# Patient Record
Sex: Female | Born: 1980 | Race: White | Hispanic: No | Marital: Married | Smoking: Never smoker
Health system: Southern US, Community
[De-identification: ages and names within clinical notes are randomized; demographics above are authoritative.]

## PROBLEM LIST (undated history)

## (undated) ENCOUNTER — Inpatient Hospital Stay (HOSPITAL_COMMUNITY): Payer: Self-pay

## (undated) DIAGNOSIS — K219 Gastro-esophageal reflux disease without esophagitis: Secondary | ICD-10-CM

## (undated) DIAGNOSIS — R112 Nausea with vomiting, unspecified: Secondary | ICD-10-CM

## (undated) DIAGNOSIS — Z9889 Other specified postprocedural states: Secondary | ICD-10-CM

## (undated) DIAGNOSIS — Z789 Other specified health status: Secondary | ICD-10-CM

---

## 2005-07-31 ENCOUNTER — Other Ambulatory Visit: Admission: RE | Admit: 2005-07-31 | Discharge: 2005-07-31 | Payer: Self-pay | Admitting: Obstetrics and Gynecology

## 2007-04-11 ENCOUNTER — Inpatient Hospital Stay (HOSPITAL_COMMUNITY): Admission: AD | Admit: 2007-04-11 | Discharge: 2007-04-12 | Payer: Self-pay | Admitting: Obstetrics and Gynecology

## 2007-04-11 ENCOUNTER — Ambulatory Visit: Payer: Self-pay | Admitting: Obstetrics and Gynecology

## 2007-06-26 ENCOUNTER — Inpatient Hospital Stay (HOSPITAL_COMMUNITY): Admission: AD | Admit: 2007-06-26 | Discharge: 2007-06-26 | Payer: Self-pay | Admitting: Obstetrics and Gynecology

## 2007-09-24 ENCOUNTER — Ambulatory Visit (HOSPITAL_COMMUNITY): Admission: RE | Admit: 2007-09-24 | Discharge: 2007-09-24 | Payer: Self-pay | Admitting: Obstetrics & Gynecology

## 2007-11-03 ENCOUNTER — Encounter (HOSPITAL_COMMUNITY): Payer: Self-pay | Admitting: Obstetrics and Gynecology

## 2007-11-03 ENCOUNTER — Ambulatory Visit: Payer: Self-pay | Admitting: Obstetrics & Gynecology

## 2007-11-03 ENCOUNTER — Inpatient Hospital Stay (HOSPITAL_COMMUNITY): Admission: AD | Admit: 2007-11-03 | Discharge: 2007-11-06 | Payer: Self-pay | Admitting: Obstetrics and Gynecology

## 2008-09-19 ENCOUNTER — Encounter: Admission: RE | Admit: 2008-09-19 | Discharge: 2008-09-19 | Payer: Self-pay | Admitting: Obstetrics and Gynecology

## 2008-11-15 ENCOUNTER — Encounter: Admission: RE | Admit: 2008-11-15 | Discharge: 2008-11-15 | Payer: Self-pay | Admitting: Family Medicine

## 2010-07-15 ENCOUNTER — Encounter: Payer: Self-pay | Admitting: Obstetrics and Gynecology

## 2010-11-06 NOTE — Op Note (Signed)
Kim Warner, Kim Warner               ACCOUNT NO.:  [id number]   MEDICAL RECORD NO.:  [id number]          PATIENT TYPE:  INP   LOCATION:  9318                          FACILITY:  [facility]   PHYSICIAN:  Zelphia Cairo, MD    DATE OF BIRTH:  [birth year]   DATE OF PROCEDURE:  11/03/2007  DATE OF DISCHARGE:                               OPERATIVE REPORT   PREOPERATIVE DIAGNOSES:  1. Twin intrauterine pregnancy at 31-2/7 weeks' gestation.  2. Vaginal bleeding.  3. Preterm labor.   PROCEDURE:  Primary low transverse cesarean delivery.   SURGEON:  Zelphia Cairo, MD   ASSISTANT:  Allie Bossier, MD   FINDINGS:  Normal pelvis.  Viable female infant x2.   SPECIMEN:  Placenta to pathology.   COMPLICATIONS:  None.   CONDITION:  Stable to recovery room.   ANESTHESIA:  Spinal.   DETAILS OF PROCEDURE:  The patient was taken to the operating room where  spinal anesthesia was found to be adequate.  She was placed in a supine  position with a left tilt.  She was prepped and draped sterilely and a  Foley catheter was inserted.  Pfannenstiel skin incision was made with a  scalpel and carried down to the underlying fascia.  The fascia was  incised in the midline.  This was extended laterally using Mayo  scissors.  Kocher clamps were used to tent up the inferior portion of  the fascia.  Fascia was dissected off the rectus muscles using Mayo  scissors.  Superior portion of the fascia was then tented upwards and  underlying rectus dissected off using the Bovie.  Peritoneum was then  identified, tented upwards, and entered sharply using Metzenbaum  scissors.  This was extended bluntly.  Bladder blade was then inserted.  Metzenbaum scissors were used to create a bladder flap.  Vesicouterine  peritoneum was then tucked under the bladder blade.   Uterine incision was then made with a scalpel and this was extended  bluntly.  Fetus A was found to be in the vertex position.  Fluid  appeared clear, slightly  blood-tinged.  Cord was clamped and cut and the  infant was taken to the awaiting pediatric staff.  Fetus B was then  found to be in the frank breech presentation.  Membranes were ruptured  for clear fluid and the infant was delivered using standard breech  maneuvers.  Cord was clamped and cut and the infant was taken to the  awaiting pediatric staff.  Placenta was then removed from the uterus and  sent for pathology.  The uterus was cleared off all clots and debris  using a dry lap sponge.  Uterine incision was reapproximated using 0  chromic in a running locked fashion.  Peritoneum was reapproximated with  0 Vicryl.  The fascia was closed with 0 PDS and the skin was closed with  staples.  The patient tolerated the procedure well.  Sponge, lap,  needle, and instrument counts were correct x2.      Zelphia Cairo, MD  Electronically Signed     GA/MEDQ  D:  11/03/2007  T:  11/04/2007  Job:  [id number]

## 2010-11-07 ENCOUNTER — Other Ambulatory Visit: Payer: Self-pay | Admitting: Family Medicine

## 2010-11-07 DIAGNOSIS — Z803 Family history of malignant neoplasm of breast: Secondary | ICD-10-CM

## 2010-11-07 DIAGNOSIS — Z1231 Encounter for screening mammogram for malignant neoplasm of breast: Secondary | ICD-10-CM

## 2010-11-09 NOTE — Discharge Summary (Signed)
Kim Warner, Kim Warner               ACCOUNT NO.:  [id number]   MEDICAL RECORD NO.:  [id number]          PATIENT TYPE:  INP   LOCATION:  9318                          FACILITY:  [facility]   PHYSICIAN:  Juluis Mire, M.D.   DATE OF BIRTH:  12/03/1980   DATE OF ADMISSION:  11/03/2007  DATE OF DISCHARGE:  11/06/2007                               DISCHARGE SUMMARY   ADMITTING DIAGNOSES:  1. Intrauterine pregnancy at 31-2/7th weeks' estimated gestational      age.  2. Twin gestation.  3. Preterm labor.  4. Vaginal bleeding.   DISCHARGE DIAGNOSES:  1. Status post low transverse cesarean section.  2. Viable female infants.   PROCEDURE:  Primary low transverse cesarean section.   REASON FOR ADMISSION:  Please see written H&P.   HOSPITAL COURSE:  The patient is a 30 year old primigravida that was  admitted to [facility] at 31-2/7th weeks' estimated  gestational age with a twin gestation complaining of vaginal bleeding.  The patient had experienced an acute onset of bright red bleeding  associated with pelvic cramping.  Upon presentation to the hospital, the  patient was noted to be contracting approximately every 3-5 minutes with  reassuring fetal heart tones x2.  On exam, vital signs were stable.  Blood pressure 123/76.  Fetal heart tones were reassuring.  Cervix was  examined to see a small amount of old blood noted in the wall and cervix  appeared to be closed with the speculum exam.  Ultrasound was performed  which revealed placenta normal without evidence of abruption or previa.  Normal amniotic fluid volume x2 and cervix had length of 2.7 cm.  Babies  were noted to be in the vertex breech presentation with 22% discordance  in growth.  The patient was now admitted for MAC tocolytics  betamethasone administration and continuous close monitoring.  After the  initiation of the magnesium sulfate, the patient did notice contractions  were less frequent, but she was somewhat  more uncomfortable.  Vaginal  bleeding had decreased.  Fetal heart tones continued to be reassuring  and cervical exam was deferred.  Magnesium sulfate was increased to 3 g  and the patient continued to have some contractions.  No further  bleeding and contractions were now approximately every 5 minutes.  Cervix was closed in posterium.  The patient was offered some Stadol.  over the course of the evening, contractions seemed to be less frequent  with no increase in vaginal bleeding and fetal heart tones continued to  be reassuring.  The patient was given the betamethasone and continued on  bedrest.  However later that evening, the patient did begin to have  increasing contractions in intensity and in frequency.  The patient was  up to the bathroom and experienced a large amount of bright red bleeding  with a large clot that was passed.  Speculum exam was performed  sterilely and was noted to have some bloody fluid within the vagina.  Cervix appeared to be closed, but about 90% effaced.  The patient was  not having to bleed through the contractions despite  the increase of the  magnesium sulfate to 3 g per hour.  The patient was advised regarding  possible abruption and preterm premature rupture of membranes and  progression of the preterm labor and decision was made to proceed with a  primary C-section.  The patient was now transferred to the operating  room where spinal anesthesia was administered without difficulty.  A low-  transverse incision was made with the delivery of viable A baby, a female  infant weighing 3 pounds 8 ounces with Apgars of 7 at 1 minute and 8 at  5 minutes.  Also baby B, a female infant weighing 4 pounds 7 ounces with  Apgars of 6 at 1 minute and 8 at 5 minutes.  Placenta was sent for  pathology and the patient tolerated the procedure well and was taken to  the recovery room in stable condition.  On postoperative day #1, the  patient was without complaint.  Babies  were both stable in the NICU on  CPAP.  Vital signs were stable.  She was afebrile.  Abdomen soft with  good return of bowel function.  Fundus was firm and nontender.  Abdominal dressing was noted to be clean, dry, and intact.  Foley was  draining with adequate amounts of urine output.  Laboratory findings  showed hemoglobin of 10.6, platelet count of 186,000, WBC count of 20.3.  On postoperative day #2, the patient was without complaint.  Babies  continued in the NICU on nasal cannula.  Vital signs were stable.  She  was afebrile.  Fundus firm and nontender.  Incision was clean, dry, and  intact.  She is voiding well.  On postoperative day #3, the patient was  without complaint.  Vital signs were stable.  Fundus firm and nontender.  Incision was clean, dry, and intact.  Staples were removed and the  patient was later discharged home.   CONDITION ON DISCHARGE:  Stable.   DIET:  Regular as tolerated.   ACTIVITY:  No heavy lifting, no driving x2 weeks, no vaginal entry.   FOLLOW-UP:  Patient to follow up in the office in 1-2 weeks for incision  check.  She is to call for temperature greater than 100 degrees,  persistent nausea or vomiting, heavy vaginal bleeding, and/or redness or  drainage from the incisional site.   DISCHARGE MEDICATIONS:  1. Tylox #30 one p.o. every 4-6 hours.  2. Motrin 600 mg every 6 hours.  3. Prenatal vitamins one p.o. daily.  4. Colace one p.o. daily.      Julio Sicks, N.P.      Juluis Mire, M.D.  Electronically Signed    CC/MEDQ  D:  11/25/2007  T:  11/25/2007  Job:  [id number]

## 2010-12-05 ENCOUNTER — Ambulatory Visit: Payer: Self-pay

## 2010-12-07 ENCOUNTER — Ambulatory Visit: Payer: Self-pay

## 2010-12-10 ENCOUNTER — Ambulatory Visit
Admission: RE | Admit: 2010-12-10 | Discharge: 2010-12-10 | Disposition: A | Discharge status: Home / Self Care | Payer: BC Managed Care – PPO | Source: Ambulatory Visit | Attending: Family Medicine | Admitting: Family Medicine

## 2010-12-10 DIAGNOSIS — Z1231 Encounter for screening mammogram for malignant neoplasm of breast: Secondary | ICD-10-CM

## 2011-01-10 ENCOUNTER — Other Ambulatory Visit: Payer: Self-pay

## 2011-03-14 LAB — URINALYSIS, ROUTINE W REFLEX MICROSCOPIC
Glucose, UA: NEGATIVE
Nitrite: NEGATIVE
Specific Gravity, Urine: >1.03 — ABNORMAL HIGH
pH: 5.5

## 2011-03-14 LAB — URINALYSIS, DIPSTICK ONLY
Bilirubin Urine: NEGATIVE
Nitrite: NEGATIVE
Protein, ur: NEGATIVE
Specific Gravity, Urine: >1.03 — ABNORMAL HIGH
Urobilinogen, UA: 0.2

## 2011-04-03 LAB — CBC
HCT: 22.2 — ABNORMAL LOW
HCT: 23.6 — ABNORMAL LOW
HCT: 23.6 — ABNORMAL LOW
HCT: 24.9 — ABNORMAL LOW
HCT: 26.9 — ABNORMAL LOW
Hemoglobin: 7.8 — CL
Hemoglobin: 8.3 — ABNORMAL LOW
Hemoglobin: 9.7 — ABNORMAL LOW
MCHC: 34.9
MCHC: 35.3
MCV: 90
MCV: 90.2
MCV: 90.6
MCV: 91.3
RBC: 2.46 — ABNORMAL LOW
RBC: 2.73 — ABNORMAL LOW
RDW: 11.7
RDW: 11.8
RDW: 11.8
RDW: 12
WBC: 14.6 — ABNORMAL HIGH
WBC: 15.9 — ABNORMAL HIGH
WBC: 17.2 — ABNORMAL HIGH

## 2011-04-03 LAB — DIFFERENTIAL
Eosinophils Absolute: 0
Lymphocytes Relative: 12
Monocytes Absolute: 0.8 — ABNORMAL HIGH
Monocytes Relative: 5

## 2011-04-03 LAB — CROSSMATCH
ABO/RH(D): O POS
Antibody Screen: NEGATIVE

## 2011-04-03 LAB — SAMPLE TO BLOOD BANK

## 2011-04-10 LAB — OB RESULTS CONSOLE GC/CHLAMYDIA
Chlamydia: NEGATIVE
Gonorrhea: NEGATIVE

## 2011-09-30 ENCOUNTER — Other Ambulatory Visit (HOSPITAL_COMMUNITY): Payer: Self-pay | Admitting: Obstetrics and Gynecology

## 2011-09-30 DIAGNOSIS — R1084 Generalized abdominal pain: Secondary | ICD-10-CM

## 2011-10-02 ENCOUNTER — Ambulatory Visit (HOSPITAL_COMMUNITY)
Admission: RE | Admit: 2011-10-02 | Discharge: 2011-10-02 | Disposition: A | Discharge status: Home / Self Care | Payer: BC Managed Care – PPO | Source: Ambulatory Visit | Attending: Obstetrics and Gynecology | Admitting: Obstetrics and Gynecology

## 2011-10-02 DIAGNOSIS — R1011 Right upper quadrant pain: Secondary | ICD-10-CM | POA: Insufficient documentation

## 2011-10-02 DIAGNOSIS — O99891 Other specified diseases and conditions complicating pregnancy: Secondary | ICD-10-CM | POA: Insufficient documentation

## 2011-10-02 DIAGNOSIS — R1084 Generalized abdominal pain: Secondary | ICD-10-CM

## 2011-10-18 ENCOUNTER — Encounter (HOSPITAL_COMMUNITY): Payer: Self-pay | Admitting: Pharmacist

## 2011-10-18 LAB — OB RESULTS CONSOLE GBS: GBS: NEGATIVE

## 2011-10-30 ENCOUNTER — Encounter (HOSPITAL_COMMUNITY)
Admission: RE | Admit: 2011-10-30 | Discharge: 2011-10-30 | Disposition: A | Discharge status: Home / Self Care | Payer: BC Managed Care – PPO | Source: Ambulatory Visit | Attending: Obstetrics and Gynecology | Admitting: Obstetrics and Gynecology

## 2011-10-30 ENCOUNTER — Encounter (HOSPITAL_COMMUNITY): Payer: Self-pay

## 2011-10-30 HISTORY — DX: Gastro-esophageal reflux disease without esophagitis: K21.9

## 2011-10-30 HISTORY — DX: Other specified postprocedural states: Z98.890

## 2011-10-30 HISTORY — DX: Other specified health status: Z78.9

## 2011-10-30 HISTORY — DX: Nausea with vomiting, unspecified: R11.2

## 2011-10-30 LAB — SURGICAL PCR SCREEN
MRSA, PCR: NEGATIVE
Staphylococcus aureus: NEGATIVE

## 2011-10-30 LAB — CBC
Platelets: 150 10*3/uL (ref 150–400)
RDW: 13.8 % (ref 11.5–15.5)
WBC: 11.2 10*3/uL — ABNORMAL HIGH (ref 4.0–10.5)

## 2011-10-30 LAB — RPR: RPR Ser Ql: NONREACTIVE

## 2011-10-30 NOTE — Patient Instructions (Addendum)
YOUR PROCEDURE IS SCHEDULED ON:11/05/11  ENTER THROUGH THE MAIN ENTRANCE OF [facility] AT:1130 am  USE DESK PHONE AND DIAL [id number] TO INFORM us OF YOUR ARRIVAL  CALL [phone] IF YOU HAVE ANY QUESTIONS OR PROBLEMS PRIOR TO YOUR ARRIVAL.  REMEMBER: DO NOT EAT AFTER MIDNIGHT : Monday  SPECIAL INSTRUCTIONS:clear liquids ok until 9am on Tuesday   YOU MAY BRUSH YOUR TEETH THE MORNING OF SURGERY   TAKE THESE MEDICINES THE DAY OF SURGERY WITH SIP OF WATER:Nexium   DO NOT WEAR JEWELRY, EYE MAKEUP, LIPSTICK OR DARK FINGERNAIL POLISH DO NOT WEAR LOTIONS  DO NOT SHAVE FOR 48 HOURS PRIOR TO SURGERY  YOU WILL NOT BE ALLOWED TO DRIVE YOURSELF HOME.  NAME OF DRIVER:Kerry Meloni

## 2011-11-03 ENCOUNTER — Encounter (HOSPITAL_COMMUNITY): Payer: Self-pay

## 2011-11-03 ENCOUNTER — Inpatient Hospital Stay (HOSPITAL_COMMUNITY)
Admission: AD | Admit: 2011-11-03 | Discharge: 2011-11-03 | Disposition: A | Discharge status: Home / Self Care | Payer: BC Managed Care – PPO | Source: Ambulatory Visit | Attending: Obstetrics and Gynecology | Admitting: Obstetrics and Gynecology

## 2011-11-03 DIAGNOSIS — O212 Late vomiting of pregnancy: Secondary | ICD-10-CM | POA: Insufficient documentation

## 2011-11-03 DIAGNOSIS — O479 False labor, unspecified: Secondary | ICD-10-CM | POA: Insufficient documentation

## 2011-11-03 LAB — URINALYSIS, ROUTINE W REFLEX MICROSCOPIC
Glucose, UA: NEGATIVE mg/dL
Hgb urine dipstick: NEGATIVE
Leukocytes, UA: NEGATIVE
Specific Gravity, Urine: <1.005 — ABNORMAL LOW (ref 1.005–1.030)

## 2011-11-03 NOTE — Discharge Instructions (Signed)
Fetal Movement Counts Patient Name: __________________________________________________ Patient Due Date: ____________________ Kick counts is highly recommended in high risk pregnancies, but it is a good idea for every pregnant woman to do. Start counting fetal movements at 28 weeks of the pregnancy. Fetal movements increase after eating a full meal or eating or drinking something sweet (the blood sugar is higher). It is also important to drink plenty of fluids (well hydrated) before doing the count. Lie on your left side because it helps with the circulation or you can sit in a comfortable chair with your arms over your belly (abdomen) with no distractions around you. DOING THE COUNT  Try to do the count the same time of day each time you do it.   Mark the day and time, then see how long it takes for you to feel 10 movements (kicks, flutters, swishes, rolls). You should have at least 10 movements within 2 hours. You will most likely feel 10 movements in much less than 2 hours. If you do not, wait an hour and count again. After a couple of days you will see a pattern.   What you are looking for is a change in the pattern or not enough counts in 2 hours. Is it taking longer in time to reach 10 movements?  SEEK MEDICAL CARE IF:  You feel less than 10 counts in 2 hours. Tried twice.   No movement in one hour.   The pattern is changing or taking longer each day to reach 10 counts in 2 hours.   You feel the baby is not moving as it usually does.  Date: ____________ Movements: ____________ Start time: ____________ Finish time: ____________  Date: ____________ Movements: ____________ Start time: ____________ Finish time: ____________ Date: ____________ Movements: ____________ Start time: ____________ Finish time: ____________ Date: ____________ Movements: ____________ Start time: ____________ Finish time: ____________ Date: ____________ Movements: ____________ Start time: ____________ Finish time:  ____________ Date: ____________ Movements: ____________ Start time: ____________ Finish time: ____________ Date: ____________ Movements: ____________ Start time: ____________ Finish time: ____________ Date: ____________ Movements: ____________ Start time: ____________ Finish time: ____________  Date: ____________ Movements: ____________ Start time: ____________ Finish time: ____________ Date: ____________ Movements: ____________ Start time: ____________ Finish time: ____________ Date: ____________ Movements: ____________ Start time: ____________ Finish time: ____________ Date: ____________ Movements: ____________ Start time: ____________ Finish time: ____________ Date: ____________ Movements: ____________ Start time: ____________ Finish time: ____________ Date: ____________ Movements: ____________ Start time: ____________ Finish time: ____________ Date: ____________ Movements: ____________ Start time: ____________ Finish time: ____________  Date: ____________ Movements: ____________ Start time: ____________ Finish time: ____________ Date: ____________ Movements: ____________ Start time: ____________ Finish time: ____________ Date: ____________ Movements: ____________ Start time: ____________ Finish time: ____________ Date: ____________ Movements: ____________ Start time: ____________ Finish time: ____________ Date: ____________ Movements: ____________ Start time: ____________ Finish time: ____________ Date: ____________ Movements: ____________ Start time: ____________ Finish time: ____________ Date: ____________ Movements: ____________ Start time: ____________ Finish time: ____________  Date: ____________ Movements: ____________ Start time: ____________ Finish time: ____________ Date: ____________ Movements: ____________ Start time: ____________ Finish time: ____________ Date: ____________ Movements: ____________ Start time: ____________ Finish time: ____________ Date: ____________ Movements:  ____________ Start time: ____________ Finish time: ____________ Date: ____________ Movements: ____________ Start time: ____________ Finish time: ____________ Date: ____________ Movements: ____________ Start time: ____________ Finish time: ____________ Date: ____________ Movements: ____________ Start time: ____________ Finish time: ____________  Date: ____________ Movements: ____________ Start time: ____________ Finish time: ____________ Date: ____________ Movements: ____________ Start time: ____________ Finish time: ____________ Date: ____________ Movements: ____________ Start time:   ____________ Finish time: ____________ Date: ____________ Movements: ____________ Start time: ____________ Finish time: ____________ Date: ____________ Movements: ____________ Start time: ____________ Finish time: ____________ Date: ____________ Movements: ____________ Start time: ____________ Finish time: ____________ Date: ____________ Movements: ____________ Start time: ____________ Finish time: ____________  Date: ____________ Movements: ____________ Start time: ____________ Finish time: ____________ Date: ____________ Movements: ____________ Start time: ____________ Finish time: ____________ Date: ____________ Movements: ____________ Start time: ____________ Finish time: ____________ Date: ____________ Movements: ____________ Start time: ____________ Finish time: ____________ Date: ____________ Movements: ____________ Start time: ____________ Finish time: ____________ Date: ____________ Movements: ____________ Start time: ____________ Finish time: ____________ Date: ____________ Movements: ____________ Start time: ____________ Finish time: ____________  Date: ____________ Movements: ____________ Start time: ____________ Finish time: ____________ Date: ____________ Movements: ____________ Start time: ____________ Finish time: ____________ Date: ____________ Movements: ____________ Start time: ____________ Finish  time: ____________ Date: ____________ Movements: ____________ Start time: ____________ Finish time: ____________ Date: ____________ Movements: ____________ Start time: ____________ Finish time: ____________ Date: ____________ Movements: ____________ Start time: ____________ Finish time: ____________ Date: ____________ Movements: ____________ Start time: ____________ Finish time: ____________  Date: ____________ Movements: ____________ Start time: ____________ Finish time: ____________ Date: ____________ Movements: ____________ Start time: ____________ Finish time: ____________ Date: ____________ Movements: ____________ Start time: ____________ Finish time: ____________ Date: ____________ Movements: ____________ Start time: ____________ Finish time: ____________ Date: ____________ Movements: ____________ Start time: ____________ Finish time: ____________ Date: ____________ Movements: ____________ Start time: ____________ Finish time: ____________ Document Released: 07/10/2006 Document Revised: 05/30/2011 Document Reviewed: 01/10/2009 [employer] Patient Information 2012 [employer].Braxton Hicks Contractions Pregnancy is commonly associated with contractions of the uterus throughout the pregnancy. Towards the end of pregnancy (32 to 34 weeks), these contractions (Braxton Hicks) can develop more often and may become more forceful. This is not true labor because these contractions do not result in opening (dilatation) and thinning of the cervix. They are sometimes difficult to tell apart from true labor because these contractions can be forceful and people have different pain tolerances. You should not feel embarrassed if you go to the hospital with false labor. Sometimes, the only way to tell if you are in true labor is for your caregiver to follow the changes in the cervix. How to tell the difference between true and false labor:  False labor.   The contractions of false labor are usually shorter,  irregular and not as hard as those of true labor.   They are often felt in the front of the lower abdomen and in the groin.   They may leave with walking around or changing positions while lying down.   They get weaker and are shorter lasting as time goes on.   These contractions are usually irregular.   They do not usually become progressively stronger, regular and closer together as with true labor.   True labor.   Contractions in true labor last 30 to 70 seconds, become very regular, usually become more intense, and increase in frequency.   They do not go away with walking.   The discomfort is usually felt in the top of the uterus and spreads to the lower abdomen and low back.   True labor can be determined by your caregiver with an exam. This will show that the cervix is dilating and getting thinner.  If there are no prenatal problems or other health problems associated with the pregnancy, it is completely safe to be sent home with false labor and await the onset of true labor. HOME CARE INSTRUCTIONS   Keep up   with your usual exercises and instructions.   Take medications as directed.   Keep your regular prenatal appointment.   Eat and drink lightly if you think you are going into labor.   If BH contractions are making you uncomfortable:   Change your activity position from lying down or resting to walking/walking to resting.   Sit and rest in a tub of warm water.   Drink 2 to 3 glasses of water. Dehydration may cause B-H contractions.   Do slow and deep breathing several times an hour.  SEEK IMMEDIATE MEDICAL CARE IF:   Your contractions continue to become stronger, more regular, and closer together.   You have a gushing, burst or leaking of fluid from the vagina.   An oral temperature above 102 F (38.9 C) develops.   You have passage of blood-tinged mucus.   You develop vaginal bleeding.   You develop continuous belly (abdominal) pain.   You have low  back pain that you never had before.   You feel the baby's head pushing down causing pelvic pressure.   The baby is not moving as much as it used to.  Document Released: 06/10/2005 Document Revised: 05/30/2011 Document Reviewed: 12/02/2008 [employer] Patient Information 2012 [employer]. 

## 2011-11-03 NOTE — MAU Note (Signed)
Patient presents with c/o of nausea since Thursday irregular contractions some are painful, frequent bowel movements but not diarrhea. Patient is a scheduled C/S for Tuesday was told by on call nurse to come in and be checked.

## 2011-11-05 ENCOUNTER — Inpatient Hospital Stay (HOSPITAL_COMMUNITY)
Admission: RE | Admit: 2011-11-05 | Discharge: 2011-11-08 | DRG: 370 | Disposition: A | Discharge status: Home / Self Care | Payer: BC Managed Care – PPO | Source: Ambulatory Visit | Attending: Obstetrics and Gynecology | Admitting: Obstetrics and Gynecology

## 2011-11-05 ENCOUNTER — Encounter (HOSPITAL_COMMUNITY)
Admission: RE | Disposition: A | Discharge status: Home / Self Care | Payer: Self-pay | Source: Ambulatory Visit | Attending: Obstetrics and Gynecology

## 2011-11-05 ENCOUNTER — Encounter (HOSPITAL_COMMUNITY): Payer: Self-pay | Admitting: *Deleted

## 2011-11-05 ENCOUNTER — Encounter (HOSPITAL_COMMUNITY): Payer: Self-pay | Admitting: Anesthesiology

## 2011-11-05 ENCOUNTER — Inpatient Hospital Stay (HOSPITAL_COMMUNITY): Payer: BC Managed Care – PPO | Admitting: Anesthesiology

## 2011-11-05 DIAGNOSIS — O34219 Maternal care for unspecified type scar from previous cesarean delivery: Principal | ICD-10-CM | POA: Diagnosis present

## 2011-11-05 DIAGNOSIS — L02219 Cutaneous abscess of trunk, unspecified: Secondary | ICD-10-CM | POA: Diagnosis not present

## 2011-11-05 DIAGNOSIS — O909 Complication of the puerperium, unspecified: Secondary | ICD-10-CM | POA: Diagnosis not present

## 2011-11-05 DIAGNOSIS — L03319 Cellulitis of trunk, unspecified: Secondary | ICD-10-CM | POA: Diagnosis not present

## 2011-11-05 DIAGNOSIS — Z302 Encounter for sterilization: Secondary | ICD-10-CM

## 2011-11-05 SURGERY — Surgical Case
Anesthesia: Spinal | Site: Abdomen | Wound class: Clean Contaminated

## 2011-11-05 MED ORDER — MORPHINE SULFATE 0.5 MG/ML IJ SOLN
INTRAMUSCULAR | Status: AC
  Filled 2011-11-05: qty 10

## 2011-11-05 MED ORDER — FENTANYL CITRATE 0.05 MG/ML IJ SOLN
INTRAMUSCULAR | Status: AC
  Filled 2011-11-05: qty 2

## 2011-11-05 MED ORDER — MENTHOL 3 MG MT LOZG: 1.0000 | LOZENGE | OROMUCOSAL | Status: DC | PRN

## 2011-11-05 MED ORDER — MEPERIDINE HCL 25 MG/ML IJ SOLN: 6.2500 mg | INTRAMUSCULAR | Status: DC | PRN

## 2011-11-05 MED ORDER — NALOXONE HCL 0.4 MG/ML IJ SOLN: 0.4000 mg | INTRAMUSCULAR | Status: DC | PRN

## 2011-11-05 MED ORDER — TETANUS-DIPHTH-ACELL PERTUSSIS 5-2.5-18.5 LF-MCG/0.5 IM SUSP
0.5000 mL | Freq: Once | INTRAMUSCULAR | Status: DC
  Filled 2011-11-05: qty 0.5

## 2011-11-05 MED ORDER — DIBUCAINE 1 % RE OINT: 1.0000 "application " | TOPICAL_OINTMENT | RECTAL | Status: DC | PRN

## 2011-11-05 MED ORDER — PHENYLEPHRINE HCL 10 MG/ML IJ SOLN
INTRAMUSCULAR | Status: DC | PRN
  Administered 2011-11-05: 80 ug via INTRAVENOUS
  Administered 2011-11-05: 40 ug via INTRAVENOUS
  Administered 2011-11-05: 80 ug via INTRAVENOUS

## 2011-11-05 MED ORDER — ONDANSETRON HCL 4 MG/2ML IJ SOLN
INTRAMUSCULAR | Status: AC
  Filled 2011-11-05: qty 2

## 2011-11-05 MED ORDER — DIPHENHYDRAMINE HCL 50 MG/ML IJ SOLN
12.5000 mg | INTRAMUSCULAR | Status: DC | PRN
Start: 2011-11-05 — End: 2011-11-08

## 2011-11-05 MED ORDER — MONTELUKAST SODIUM 10 MG PO TABS
10.0000 mg | ORAL_TABLET | Freq: Every day | ORAL | Status: DC
  Administered 2011-11-05 – 2011-11-07 (×3): 10 mg via ORAL
  Filled 2011-11-05 (×3): qty 1

## 2011-11-05 MED ORDER — NALBUPHINE HCL 10 MG/ML IJ SOLN
5.0000 mg | INTRAMUSCULAR | Status: DC | PRN
Start: 2011-11-05 — End: 2011-11-08
  Filled 2011-11-05: qty 1

## 2011-11-05 MED ORDER — CEFAZOLIN SODIUM 1-5 GM-% IV SOLN
INTRAVENOUS | Status: AC
  Filled 2011-11-05: qty 50

## 2011-11-05 MED ORDER — PHENYLEPHRINE 40 MCG/ML (10ML) SYRINGE FOR IV PUSH (FOR BLOOD PRESSURE SUPPORT)
PREFILLED_SYRINGE | INTRAVENOUS | Status: AC
  Filled 2011-11-05: qty 5

## 2011-11-05 MED ORDER — SODIUM CHLORIDE 0.9 % IV SOLN: 1.0000 ug/kg/h | INTRAVENOUS | Status: DC | PRN

## 2011-11-05 MED ORDER — FENTANYL CITRATE 0.05 MG/ML IJ SOLN
INTRAMUSCULAR | Status: DC | PRN
  Administered 2011-11-05: 25 ug via INTRATHECAL

## 2011-11-05 MED ORDER — KETOROLAC TROMETHAMINE 30 MG/ML IJ SOLN
INTRAMUSCULAR | Status: AC
  Administered 2011-11-05: 30 mg via INTRAMUSCULAR
  Filled 2011-11-05: qty 1

## 2011-11-05 MED ORDER — ONDANSETRON HCL 4 MG PO TABS: 4.0000 mg | ORAL_TABLET | ORAL | Status: DC | PRN

## 2011-11-05 MED ORDER — NALBUPHINE HCL 10 MG/ML IJ SOLN
5.0000 mg | INTRAMUSCULAR | Status: DC | PRN
  Administered 2011-11-05 (×2): 5 mg via SUBCUTANEOUS
  Filled 2011-11-05: qty 1

## 2011-11-05 MED ORDER — DIPHENHYDRAMINE HCL 25 MG PO CAPS
25.0000 mg | ORAL_CAPSULE | ORAL | Status: DC | PRN
  Administered 2011-11-05 – 2011-11-06 (×2): 25 mg via ORAL
  Filled 2011-11-05 (×4): qty 1

## 2011-11-05 MED ORDER — EPHEDRINE SULFATE 50 MG/ML IJ SOLN
INTRAMUSCULAR | Status: DC | PRN
  Administered 2011-11-05 (×2): 5 mg via INTRAVENOUS

## 2011-11-05 MED ORDER — ONDANSETRON HCL 4 MG/2ML IJ SOLN: 4.0000 mg | INTRAMUSCULAR | Status: DC | PRN

## 2011-11-05 MED ORDER — SIMETHICONE 80 MG PO CHEW
80.0000 mg | CHEWABLE_TABLET | ORAL | Status: DC | PRN
  Administered 2011-11-07: 80 mg via ORAL

## 2011-11-05 MED ORDER — SENNOSIDES-DOCUSATE SODIUM 8.6-50 MG PO TABS
2.0000 | ORAL_TABLET | Freq: Every day | ORAL | Status: DC
  Administered 2011-11-05 – 2011-11-07 (×3): 2 via ORAL

## 2011-11-05 MED ORDER — KETOROLAC TROMETHAMINE 30 MG/ML IJ SOLN
30.0000 mg | Freq: Four times a day (QID) | INTRAMUSCULAR | Status: AC | PRN
  Administered 2011-11-05 – 2011-11-06 (×2): 30 mg via INTRAVENOUS
  Filled 2011-11-05 (×2): qty 1

## 2011-11-05 MED ORDER — ACETAMINOPHEN 325 MG PO TABS: 325.0000 mg | ORAL_TABLET | ORAL | Status: DC | PRN

## 2011-11-05 MED ORDER — MORPHINE SULFATE (PF) 0.5 MG/ML IJ SOLN
INTRAMUSCULAR | Status: DC | PRN
  Administered 2011-11-05: .1 mg via INTRATHECAL

## 2011-11-05 MED ORDER — KETOROLAC TROMETHAMINE 30 MG/ML IJ SOLN
30.0000 mg | Freq: Four times a day (QID) | INTRAMUSCULAR | Status: AC | PRN
  Administered 2011-11-05: 30 mg via INTRAMUSCULAR

## 2011-11-05 MED ORDER — NALBUPHINE SYRINGE 5 MG/0.5 ML
INJECTION | INTRAMUSCULAR | Status: AC
  Administered 2011-11-05: 5 mg via SUBCUTANEOUS
  Filled 2011-11-05: qty 0.5

## 2011-11-05 MED ORDER — SODIUM CHLORIDE 0.9 % IJ SOLN: 3.0000 mL | INTRAMUSCULAR | Status: DC | PRN

## 2011-11-05 MED ORDER — CEFAZOLIN SODIUM 1-5 GM-% IV SOLN
1.0000 g | INTRAVENOUS | Status: AC
  Administered 2011-11-05: 1 g via INTRAVENOUS

## 2011-11-05 MED ORDER — BUPIVACAINE HCL (PF) 0.25 % IJ SOLN
INTRAMUSCULAR | Status: DC | PRN
  Administered 2011-11-05: 10 mL

## 2011-11-05 MED ORDER — EPHEDRINE 5 MG/ML INJ
INTRAVENOUS | Status: AC
  Filled 2011-11-05: qty 10

## 2011-11-05 MED ORDER — PROMETHAZINE HCL 25 MG/ML IJ SOLN: 6.2500 mg | INTRAMUSCULAR | Status: DC | PRN

## 2011-11-05 MED ORDER — FENTANYL CITRATE 0.05 MG/ML IJ SOLN: 25.0000 ug | INTRAMUSCULAR | Status: DC | PRN

## 2011-11-05 MED ORDER — BUPIVACAINE HCL (PF) 0.25 % IJ SOLN
INTRAMUSCULAR | Status: AC
  Filled 2011-11-05: qty 30

## 2011-11-05 MED ORDER — OXYTOCIN 20 UNITS IN LACTATED RINGERS INFUSION - SIMPLE
INTRAVENOUS | Status: DC | PRN
  Administered 2011-11-05: 20 [IU] via INTRAVENOUS

## 2011-11-05 MED ORDER — SIMETHICONE 80 MG PO CHEW
80.0000 mg | CHEWABLE_TABLET | Freq: Three times a day (TID) | ORAL | Status: DC
  Administered 2011-11-05 – 2011-11-08 (×9): 80 mg via ORAL

## 2011-11-05 MED ORDER — LORATADINE 10 MG PO TABS
10.0000 mg | ORAL_TABLET | Freq: Every day | ORAL | Status: DC
  Administered 2011-11-06 – 2011-11-08 (×3): 10 mg via ORAL
  Filled 2011-11-05 (×4): qty 1

## 2011-11-05 MED ORDER — LACTATED RINGERS IV SOLN
INTRAVENOUS | Status: DC
  Administered 2011-11-05 (×3): via INTRAVENOUS

## 2011-11-05 MED ORDER — IBUPROFEN 600 MG PO TABS
600.0000 mg | ORAL_TABLET | Freq: Four times a day (QID) | ORAL | Status: DC
  Administered 2011-11-06 – 2011-11-08 (×9): 600 mg via ORAL
  Filled 2011-11-05 (×9): qty 1

## 2011-11-05 MED ORDER — ZOLPIDEM TARTRATE 5 MG PO TABS: 5.0000 mg | ORAL_TABLET | Freq: Every evening | ORAL | Status: DC | PRN

## 2011-11-05 MED ORDER — OXYCODONE-ACETAMINOPHEN 5-325 MG PO TABS
1.0000 | ORAL_TABLET | ORAL | Status: DC | PRN
  Administered 2011-11-06 (×3): 2 via ORAL
  Administered 2011-11-06: 1 via ORAL
  Administered 2011-11-07 – 2011-11-08 (×7): 2 via ORAL
  Filled 2011-11-05: qty 2
  Filled 2011-11-05: qty 1
  Filled 2011-11-05 (×7): qty 2
  Filled 2011-11-05 (×2): qty 1
  Filled 2011-11-05: qty 2

## 2011-11-05 MED ORDER — BUPIVACAINE IN DEXTROSE 0.75-8.25 % IT SOLN
INTRATHECAL | Status: DC | PRN
  Administered 2011-11-05: 12 mg via INTRATHECAL

## 2011-11-05 MED ORDER — LACTATED RINGERS IV SOLN: INTRAVENOUS | Status: DC

## 2011-11-05 MED ORDER — ONDANSETRON HCL 4 MG/2ML IJ SOLN: 4.0000 mg | Freq: Three times a day (TID) | INTRAMUSCULAR | Status: DC | PRN

## 2011-11-05 MED ORDER — SCOPOLAMINE 1 MG/3DAYS TD PT72
1.0000 | MEDICATED_PATCH | Freq: Once | TRANSDERMAL | Status: DC
  Administered 2011-11-05: 1.5 mg via TRANSDERMAL

## 2011-11-05 MED ORDER — SCOPOLAMINE 1 MG/3DAYS TD PT72
MEDICATED_PATCH | TRANSDERMAL | Status: AC
  Administered 2011-11-05: 1.5 mg via TRANSDERMAL
  Filled 2011-11-05: qty 1

## 2011-11-05 MED ORDER — PRENATAL MULTIVITAMIN CH
1.0000 | ORAL_TABLET | Freq: Every day | ORAL | Status: DC
  Filled 2011-11-05 (×3): qty 1

## 2011-11-05 MED ORDER — MIDAZOLAM HCL 2 MG/2ML IJ SOLN: 0.5000 mg | Freq: Once | INTRAMUSCULAR | Status: DC | PRN

## 2011-11-05 MED ORDER — WITCH HAZEL-GLYCERIN EX PADS: 1.0000 "application " | MEDICATED_PAD | CUTANEOUS | Status: DC | PRN

## 2011-11-05 MED ORDER — LANOLIN HYDROUS EX OINT: 1.0000 "application " | TOPICAL_OINTMENT | CUTANEOUS | Status: DC | PRN

## 2011-11-05 MED ORDER — LACTATED RINGERS IV SOLN
INTRAVENOUS | Status: DC
  Administered 2011-11-05: via INTRAVENOUS

## 2011-11-05 MED ORDER — METOCLOPRAMIDE HCL 5 MG/ML IJ SOLN: 10.0000 mg | Freq: Three times a day (TID) | INTRAMUSCULAR | Status: DC | PRN

## 2011-11-05 MED ORDER — OXYTOCIN 20 UNITS IN LACTATED RINGERS INFUSION - SIMPLE
125.0000 mL/h | INTRAVENOUS | Status: AC
  Administered 2011-11-05: 125 mL/h via INTRAVENOUS

## 2011-11-05 MED ORDER — DIPHENHYDRAMINE HCL 50 MG/ML IJ SOLN: 25.0000 mg | INTRAMUSCULAR | Status: DC | PRN

## 2011-11-05 MED ORDER — SCOPOLAMINE 1 MG/3DAYS TD PT72: 1.0000 | MEDICATED_PATCH | Freq: Once | TRANSDERMAL | Status: DC

## 2011-11-05 MED ORDER — OXYTOCIN 20 UNITS IN LACTATED RINGERS INFUSION - SIMPLE
INTRAVENOUS | Status: AC
  Administered 2011-11-05: 125 mL/h via INTRAVENOUS
  Filled 2011-11-05: qty 1000

## 2011-11-05 MED ORDER — ACETAMINOPHEN 10 MG/ML IV SOLN: 1000.0000 mg | Freq: Four times a day (QID) | INTRAVENOUS | Status: AC | PRN

## 2011-11-05 MED ORDER — DIPHENHYDRAMINE HCL 25 MG PO CAPS
25.0000 mg | ORAL_CAPSULE | Freq: Four times a day (QID) | ORAL | Status: DC | PRN
  Administered 2011-11-07: 25 mg via ORAL

## 2011-11-05 MED ORDER — OXYTOCIN 10 UNIT/ML IJ SOLN
INTRAMUSCULAR | Status: AC
  Filled 2011-11-05: qty 4

## 2011-11-05 MED ORDER — ONDANSETRON HCL 4 MG/2ML IJ SOLN
INTRAMUSCULAR | Status: DC | PRN
  Administered 2011-11-05: 4 mg via INTRAVENOUS

## 2011-11-05 SURGICAL SUPPLY — 27 items
BARRIER ADHS 3X4 INTERCEED (GAUZE/BANDAGES/DRESSINGS) IMPLANT
CHLORAPREP W/TINT 26ML (MISCELLANEOUS) ×2 IMPLANT
CLOTH BEACON ORANGE TIMEOUT ST (SAFETY) ×2 IMPLANT
CONTAINER PREFILL 10% NBF 15ML (MISCELLANEOUS) IMPLANT
DRSG COVADERM 4X10 (GAUZE/BANDAGES/DRESSINGS) ×2 IMPLANT
ELECT REM PT RETURN 9FT ADLT (ELECTROSURGICAL) ×2
ELECTRODE REM PT RTRN 9FT ADLT (ELECTROSURGICAL) ×1 IMPLANT
EXTRACTOR VACUUM M CUP 4 TUBE (SUCTIONS) IMPLANT
GLOVE BIO SURGEON STRL SZ 6.5 (GLOVE) ×4 IMPLANT
GOWN PREVENTION PLUS LG XLONG (DISPOSABLE) ×6 IMPLANT
KIT ABG SYR 3ML LUER SLIP (SYRINGE) IMPLANT
NEEDLE HYPO 22GX1.5 SAFETY (NEEDLE) ×2 IMPLANT
NEEDLE HYPO 25X5/8 SAFETYGLIDE (NEEDLE) ×2 IMPLANT
NS IRRIG 1000ML POUR BTL (IV SOLUTION) ×2 IMPLANT
PACK C SECTION WH (CUSTOM PROCEDURE TRAY) ×2 IMPLANT
SLEEVE SCD COMPRESS KNEE MED (MISCELLANEOUS) IMPLANT
STAPLER VISISTAT 35W (STAPLE) IMPLANT
SUT CHROMIC 0 CTX 36 (SUTURE) ×4 IMPLANT
SUT PLAIN 0 NONE (SUTURE) IMPLANT
SUT PLAIN 2 0 XLH (SUTURE) IMPLANT
SUT VIC AB 0 CT1 27 (SUTURE) ×3
SUT VIC AB 0 CT1 27XBRD ANBCTR (SUTURE) ×3 IMPLANT
SUT VIC AB 4-0 KS 27 (SUTURE) IMPLANT
SYR CONTROL 10ML LL (SYRINGE) ×2 IMPLANT
TOWEL OR 17X24 6PK STRL BLUE (TOWEL DISPOSABLE) ×4 IMPLANT
TRAY FOLEY CATH 14FR (SET/KITS/TRAYS/PACK) ×2 IMPLANT
WATER STERILE IRR 1000ML POUR (IV SOLUTION) ×2 IMPLANT

## 2011-11-05 NOTE — Brief Op Note (Signed)
11/05/2011  1:44 PM  PATIENT:  Kim Warner  31 y.o. female  PRE-OPERATIVE DIAGNOSIS:  Previous Cesarean Section, IUP at 39 weeks, desires permanent sterilization  POST-OPERATIVE DIAGNOSIS:   Same  PROCEDURE:  Procedure(s) (LRB): Repeat Low Transverse CESAREAN SECTION (N/A) Bilateral Tubal Ligation with Filshie clips SURGEON:  Surgeon(s) and Role:    * Jeani Hawking, MD - Primary  PHYSICIAN ASSISTANT:   ASSISTANTS: none   ANESTHESIA:   spinal  EBL:  Total I/O In: 2000 [I.V.:2000] Out: 800 [Urine:300; Blood:500]  BLOOD ADMINISTERED:none  DRAINS: Urinary Catheter (Foley)   LOCAL MEDICATIONS USED:  XYLOCAINE   SPECIMEN:  No Specimen  DISPOSITION OF SPECIMEN:  N/A  COUNTS:  YES  TOURNIQUET:  * No tourniquets in log *  DICTATION: .Other Dictation: Dictation Number [id number]  PLAN OF CARE: Admit to inpatient   PATIENT DISPOSITION:  PACU - hemodynamically stable.   Delay start of Pharmacological VTE agent (>24hrs) due to surgical blood loss or risk of bleeding: yes

## 2011-11-05 NOTE — Anesthesia Procedure Notes (Signed)
Spinal  Patient location during procedure: OR Start time: 11/05/2011 1:09 PM Staffing Anesthesiologist: Brayton Caves R Performed by: anesthesiologist  Preanesthetic Checklist Completed: patient identified, site marked, surgical consent, pre-op evaluation, timeout performed, IV checked, risks and benefits discussed and monitors and equipment checked Spinal Block Patient position: sitting Prep: DuraPrep Patient monitoring: heart rate, cardiac monitor, continuous pulse ox and blood pressure Approach: midline Location: L3-4 Injection technique: single-shot Needle Needle type: Sprotte  Needle gauge: 24 G Needle length: 9 cm Assessment Sensory level: T4 Additional Notes Patient identified.  Risk benefits discussed including failed block, incomplete pain control, headache, nerve damage, paralysis, blood pressure changes, nausea, vomiting, reactions to medication both toxic or allergic, and postpartum back pain.  Patient expressed understanding and wished to proceed.  All questions were answered.  Sterile technique used throughout procedure.  CSF was clear.  No parasthesia or other complications.  Please see nursing notes for vital signs.

## 2011-11-05 NOTE — Transfer of Care (Signed)
Immediate Anesthesia Transfer of Care Note  Patient: Kim Warner  Procedure(s) Performed: Procedure(s) (LRB): CESAREAN SECTION (N/A)  Patient Location: PACU  Anesthesia Type: Spinal  Level of Consciousness: awake, alert  and oriented  Airway & Oxygen Therapy: Patient Spontanous Breathing  Post-op Assessment: Report given to PACU RN and Post -op Vital signs reviewed and stable  Post vital signs: Reviewed and stable  Complications: No apparent anesthesia complications

## 2011-11-05 NOTE — Anesthesia Preprocedure Evaluation (Signed)
Anesthesia Evaluation  Patient identified by MRN, date of birth, ID band Patient awake    Reviewed: Allergy & Precautions, H&P , NPO status , Patient's Chart, lab work & pertinent test results  History of Anesthesia Complications (+) PONV  Airway Mallampati: II      Dental No notable dental hx.    Pulmonary neg pulmonary ROS,  breath sounds clear to auscultation  Pulmonary exam normal       Cardiovascular Exercise Tolerance: Good negative cardio ROS  Rhythm:regular Rate:Normal     Neuro/Psych negative neurological ROS  negative psych ROS   GI/Hepatic negative GI ROS, Neg liver ROS, GERD-  Controlled,  Endo/Other  negative endocrine ROS  Renal/GU negative Renal ROS  negative genitourinary   Musculoskeletal   Abdominal Normal abdominal exam  (+)   Peds  Hematology negative hematology ROS (+)   Anesthesia Other Findings   Reproductive/Obstetrics (+) Pregnancy                           Anesthesia Physical Anesthesia Plan  ASA: II  Anesthesia Plan: Spinal   Post-op Pain Management:    Induction:   Airway Management Planned:   Additional Equipment:   Intra-op Plan:   Post-operative Plan:   Informed Consent: I have reviewed the patients History and Physical, chart, labs and discussed the procedure including the risks, benefits and alternatives for the proposed anesthesia with the patient or authorized representative who has indicated his/her understanding and acceptance.     Plan Discussed with: Anesthesiologist, CRNA and Surgeon  Anesthesia Plan Comments:         Anesthesia Quick Evaluation  

## 2011-11-05 NOTE — Anesthesia Postprocedure Evaluation (Signed)
Anesthesia Post Note  Patient: Kim Warner  Procedure(s) Performed: Procedure(s) (LRB): CESAREAN SECTION (N/A)  Anesthesia type: Spinal  Patient location: PACU  Post pain: Pain level controlled  Post assessment: Post-op Vital signs reviewed  Last Vitals:  Filed Vitals:   11/05/11 1430  BP: 105/47  Pulse: 64  Temp:   Resp: 20    Post vital signs: Reviewed  Level of consciousness: awake  Complications: No apparent anesthesia complications

## 2011-11-05 NOTE — Op Note (Signed)
NAMENANDANA, KROLIKOWSKI               ACCOUNT NO.:  [id number]  MEDICAL RECORD NO.:  [id number]  LOCATION:  9127                          FACILITY:  [facility]  PHYSICIAN:  Benjamim Harnish L. Kerstyn Coryell, M.D.DATE OF BIRTH:  [birth year]  DATE OF PROCEDURE:  11/05/2011 DATE OF DISCHARGE:                              OPERATIVE REPORT   PREOPERATIVE DIAGNOSES:  Previous cesarean section, intrauterine pregnancy at 39 weeks and desires permanent sterilization.  POSTOPERATIVE DIAGNOSES:  Previous cesarean section, intrauterine pregnancy at 39 weeks and desires permanent sterilization.  PROCEDURE:  Repeat low transverse cesarean section and bilateral tubal ligation with Filshie clips.  SURGEON:  Keyanni Whittinghill L. Vincente Poli, M.D.  ASSISTANT:  None.  ANESTHESIA:  Spinal.  BLOOD ADMINISTERED:  None.  DRAINS:  Foley.  PATHOLOGY:  None.  PROCEDURE:  The patient was taken to the operating room.  Her spinal was placed and she was placed in the usual position.  She was prepped and draped.  A Foley catheter was inserted and draining clear urine.  A low transverse incision was made, carried down to the fascia.  Fascia scored in midline, extended laterally.  Rectus muscles were separated in the midline.  The peritoneum was entered bluntly.  The peritoneal incision was then stretched.  The bladder blade was inserted.  The lower uterine segment was identified and the bladder flap was created sharply and then digitally.  The bladder blade was then readjusted.  A low transverse incision was made in the uterus.  Uterus was entered using a hemostat. Amniotic fluid was clear.  The baby was in cephalic presentation, was a female infant and delivered easily with 1 pull of the vacuum without a pop off.  The cord was clamped and cut.  The baby was handed to the awaiting neonatal team.  Apgars were 9 and 9.  The uterus was exteriorized, cleared of all clots and debris.  The placenta was removed, noted to be normal, intact with a  3-vessel cord.  The uterine incision was closed in 1 layer using 0 chromic in a running, locked stitch.  At this point, we then placed Filshie clips across each mid portion of the fallopian tube, making sure we identified the tube out to the fimbriated end.  Adnexa were normal.  The uterus was returned to the abdomen.  Irrigation was performed.  Hemostasis was excellent.  The peritoneum was closed using 0 Vicryl.  The fascia was closed using 0 Vicryl running stitch.  After irrigation of the subcutaneous layer, the skin was closed with a subcuticular using 4-0 Vicryl, and local was infiltrated, Dermabond was applied.  All sponge, lap, and instrument counts were correct x2.  The patient went to recovery room in stable condition.     Brandn Mcgath L. Vincente Poli, M.D.     Florestine Avers  D:  11/05/2011  T:  11/05/2011  Job:  [id number]

## 2011-11-05 NOTE — H&P (Signed)
31 year old G 2 P 0 presents for Repeat LTCS and BTL. No complaints. PNC see [facility] GBBS is negative  Afebrile Vital signs stable General alert and oriented Lung CTA B Car RRR Abdomen is soft and gravid  IMPRESSION: IUP at 39 weeks plus Her EDC is 11/12/11 and Incorrect date is entered in EPIC Previous C Section x 1 Desires permanent sterilization  PLAN: Repeat LTCS BTL Consent signed

## 2011-11-06 ENCOUNTER — Encounter (HOSPITAL_COMMUNITY): Payer: Self-pay | Admitting: Obstetrics and Gynecology

## 2011-11-06 LAB — CBC
HCT: 33.9 % — ABNORMAL LOW (ref 36.0–46.0)
Hemoglobin: 11.6 g/dL — ABNORMAL LOW (ref 12.0–15.0)
MCV: 94.7 fL (ref 78.0–100.0)
WBC: 16 10*3/uL — ABNORMAL HIGH (ref 4.0–10.5)

## 2011-11-06 MED ORDER — POLYETHYLENE GLYCOL 3350 17 G PO PACK
17.0000 g | PACK | Freq: Every day | ORAL | Status: DC
  Administered 2011-11-06 – 2011-11-07 (×2): 17 g via ORAL
  Filled 2011-11-06 (×3): qty 1

## 2011-11-06 NOTE — Addendum Note (Signed)
Addendum  created 11/06/11 0943 by Shanon Payor, CRNA   Modules edited:Notes Section

## 2011-11-06 NOTE — Progress Notes (Signed)
Subjective: Postpartum Day 1: Cesarean Delivery Patient reports incisional pain, tolerating PO, + flatus and no problems voiding.    Objective: Vital signs in last 24 hours: Temp:  [97.6 F (36.4 C)-98.8 F (37.1 C)] 98.4 F (36.9 C) (05/15 0558) Pulse Rate:  [54-88] 63  (05/15 0558) Resp:  [15-25] 18  (05/15 0558) BP: (74-116)/(44-66) 98/60 mmHg (05/15 0558) SpO2:  [95 %-100 %] 96 % (05/15 0558) Weight:  [81.647 kg (180 lb)] 81.647 kg (180 lb) (05/14 1655)  Physical Exam:  General: alert and cooperative Lochia: appropriate Uterine Fundus: firm Incision: healing well DVT Evaluation: No evidence of DVT seen on physical exam.   Basename 11/06/11 0510  HGB 11.6*  HCT 33.9*    Assessment/Plan: Status post Cesarean section. Doing well postoperatively.  Continue current care Miralax qd.  Laylynn Campanella G 11/06/2011, 7:54 AM

## 2011-11-06 NOTE — Anesthesia Postprocedure Evaluation (Signed)
  Anesthesia Post-op Note  Patient: Kim Warner  Procedure(s) Performed: Procedure(s) (LRB): CESAREAN SECTION (N/A)  Patient Location: Mother/Baby  Anesthesia Type: Spinal  Level of Consciousness: awake, alert  and oriented  Airway and Oxygen Therapy: Patient Spontanous Breathing  Post-op Pain: none  Post-op Assessment: Post-op Vital signs reviewed, Patient's Cardiovascular Status Stable, No headache, No backache, No residual numbness and No residual motor weakness  Post-op Vital Signs: Reviewed and stable  Complications: No apparent anesthesia complications

## 2011-11-07 NOTE — Progress Notes (Signed)
Subjective: Postpartum Day 2: Cesarean Delivery Patient reports tolerating PO, + flatus and no problems voiding.    Objective: Vital signs in last 24 hours: Temp:  [98 F (36.7 C)-98.7 F (37.1 C)] 98.2 F (36.8 C) (05/16 0543) Pulse Rate:  [63-79] 79  (05/16 0543) Resp:  [18] 18  (05/16 0543) BP: (95-119)/(61-74) 119/74 mmHg (05/16 0543)  Physical Exam:  General: alert and cooperative Lochia: appropriate Uterine Fundus: firm Incision: healing well DVT Evaluation: No evidence of DVT seen on physical exam.   Basename 11/06/11 0510  HGB 11.6*  HCT 33.9*    Assessment/Plan: Status post Cesarean section. Doing well postoperatively.  Continue current care.  Marvon Shillingburg G 11/07/2011, 7:58 AM

## 2011-11-08 ENCOUNTER — Encounter (HOSPITAL_COMMUNITY): Payer: Self-pay

## 2011-11-08 MED ORDER — OXYCODONE-ACETAMINOPHEN 5-325 MG PO TABS: 1.0000 | ORAL_TABLET | ORAL | Status: AC | PRN

## 2011-11-08 MED ORDER — AMOXICILLIN-POT CLAVULANATE 875-125 MG PO TABS: 1.0000 | ORAL_TABLET | Freq: Two times a day (BID) | ORAL | Status: AC

## 2011-11-08 MED ORDER — AMOXICILLIN-POT CLAVULANATE 875-125 MG PO TABS
1.0000 | ORAL_TABLET | Freq: Two times a day (BID) | ORAL | Status: DC
  Filled 2011-11-08: qty 1

## 2011-11-08 MED ORDER — IBUPROFEN 600 MG PO TABS: 600.0000 mg | ORAL_TABLET | Freq: Four times a day (QID) | ORAL | Status: AC

## 2011-11-08 NOTE — Progress Notes (Signed)
Subjective: Postpartum Day 3: Cesarean Delivery Patient reports incisional pain, tolerating PO, + flatus and no problems voiding.    Objective: Vital signs in last 24 hours: Temp:  [97.4 F (36.3 C)-98.3 F (36.8 C)] 97.4 F (36.3 C) (05/17 0553) Pulse Rate:  [63-74] 74  (05/17 0553) Resp:  [18] 18  (05/17 0553) BP: (101-103)/(60-67) 101/60 mmHg (05/17 0553)  Physical Exam:  General: alert and cooperative Lochia: appropriate Uterine Fundus: firm Incision: erythema noted superior to incision, steristrips applied DVT Evaluation: No evidence of DVT seen on physical exam.   Basename 11/06/11 0510  HGB 11.6*  HCT 33.9*    Assessment/Plan: Status post Cesarean section. Postoperative course complicated by incisional cellulitis  Discharge home with standard precautions and return to clinic in 1 week.  Jorell Agne G 11/08/2011, 7:57 AM

## 2011-11-08 NOTE — Discharge Summary (Signed)
Obstetric Discharge Summary Reason for Admission: cesarean section Prenatal Procedures: ultrasound Intrapartum Procedures: cesarean: low cervical, transverse and tubal ligation Postpartum Procedures: none Complications-Operative and Postpartum: incisional cellulitis Hemoglobin  Date Value Range Status  11/06/2011 11.6* 12.0-15.0 (g/dL) Final     HCT  Date Value Range Status  11/06/2011 33.9* 36.0-46.0 (%) Final    Physical Exam:  General: alert and cooperative Lochia: appropriate Uterine Fundus: firm Incision: erythema noted superior to incision DVT Evaluation: No evidence of DVT seen on physical exam.  Discharge Diagnoses: Term Pregnancy-delivered  Discharge Information: Date: 11/08/2011 Activity: pelvic rest Diet: routine Medications: None, Ibuprofen, Percocet and Augmentin Condition: stable Instructions: refer to practice specific booklet Discharge to: home   Newborn Data: Live born female  Birth Weight: 8 lb 1.8 oz (3680 g) APGAR: 9, 9  Home with mother.  Juquan Reznick G 11/08/2011, 8:12 AM

## 2011-12-18 ENCOUNTER — Other Ambulatory Visit: Payer: Self-pay | Admitting: Obstetrics and Gynecology

## 2012-08-26 ENCOUNTER — Other Ambulatory Visit: Payer: Self-pay

## 2012-08-26 DIAGNOSIS — Z1231 Encounter for screening mammogram for malignant neoplasm of breast: Secondary | ICD-10-CM

## 2012-10-01 ENCOUNTER — Ambulatory Visit: Payer: BC Managed Care – PPO

## 2012-10-29 ENCOUNTER — Ambulatory Visit
Admission: RE | Admit: 2012-10-29 | Discharge: 2012-10-29 | Disposition: A | Discharge status: Home / Self Care | Payer: BC Managed Care – PPO | Source: Ambulatory Visit

## 2012-10-29 DIAGNOSIS — Z1231 Encounter for screening mammogram for malignant neoplasm of breast: Secondary | ICD-10-CM

## 2013-10-12 IMAGING — US US ABDOMEN COMPLETE
1 series · 14 of 25 positions shown · non-contrast
Comparison: None.

CLINICAL DATA: Right upper quadrant abdominal pain.  34 weeks
pregnant.

COMPLETE ABDOMINAL ULTRASOUND

[Series 1: us abdomen complete · 14 of 74 slices shown]
[im 1/74]
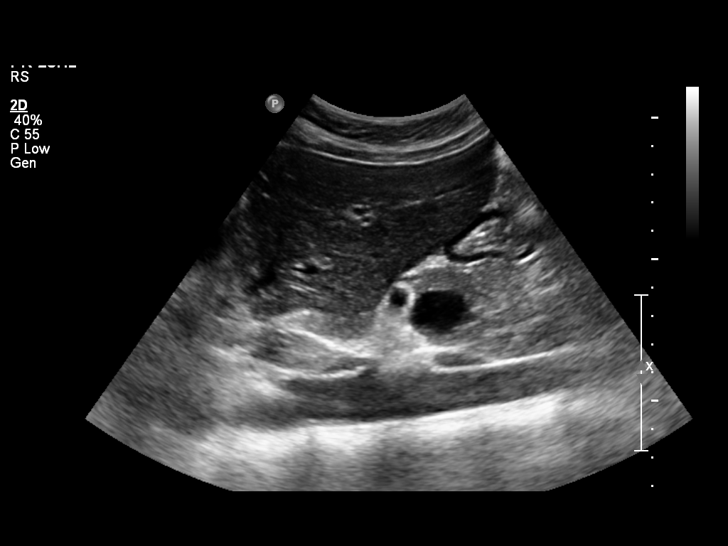
[im 7/74]
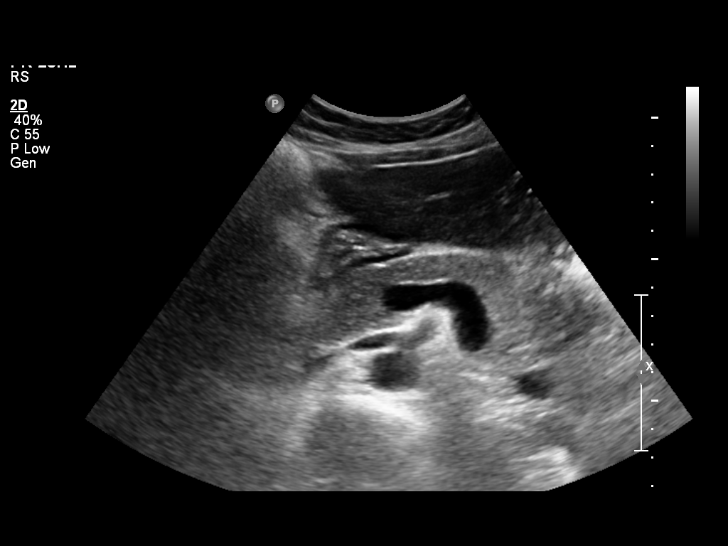
[im 13/74]
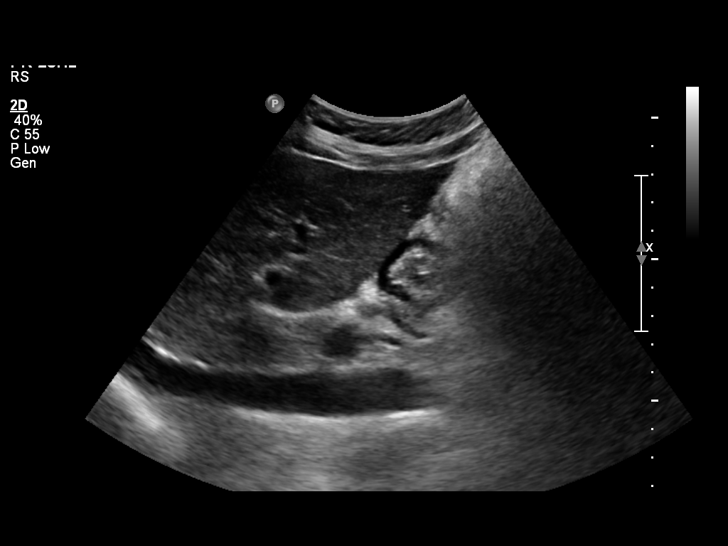
[im 19/74]
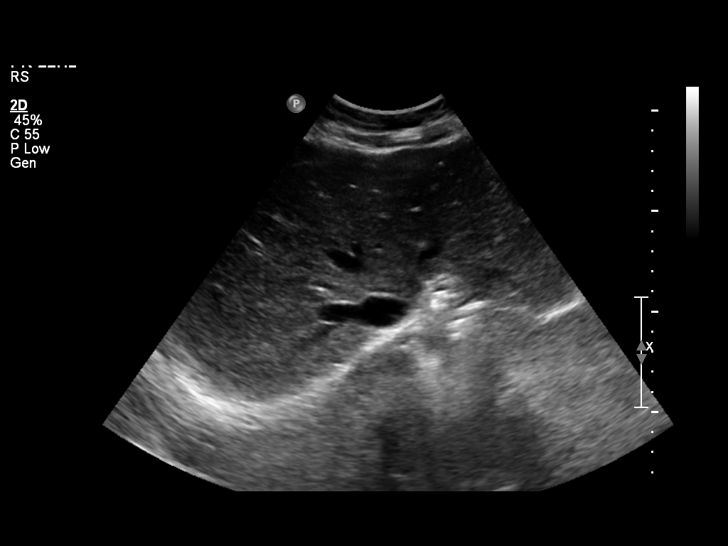
[im 25/74]
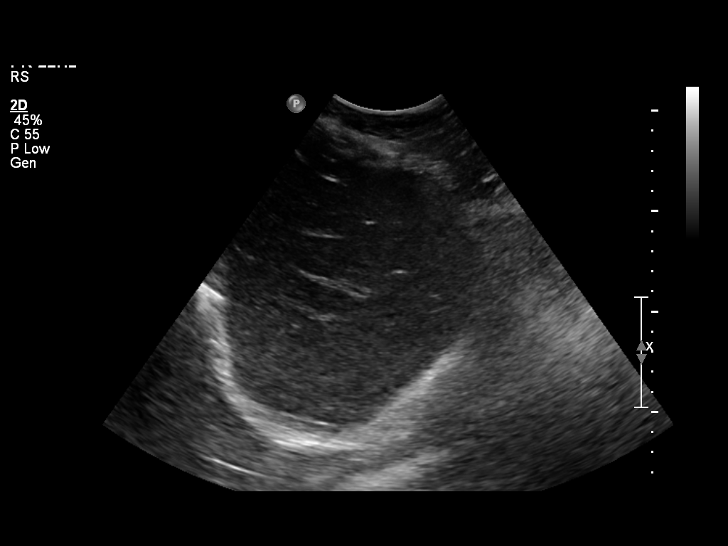
[im 28/74]
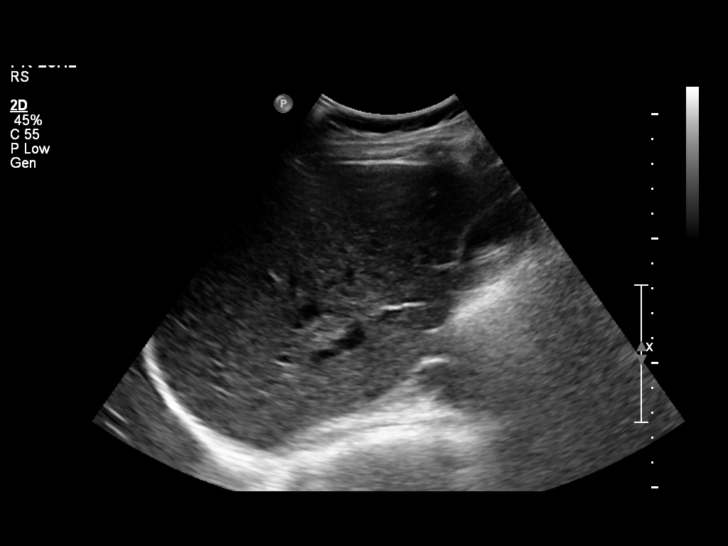
[im 34/74]
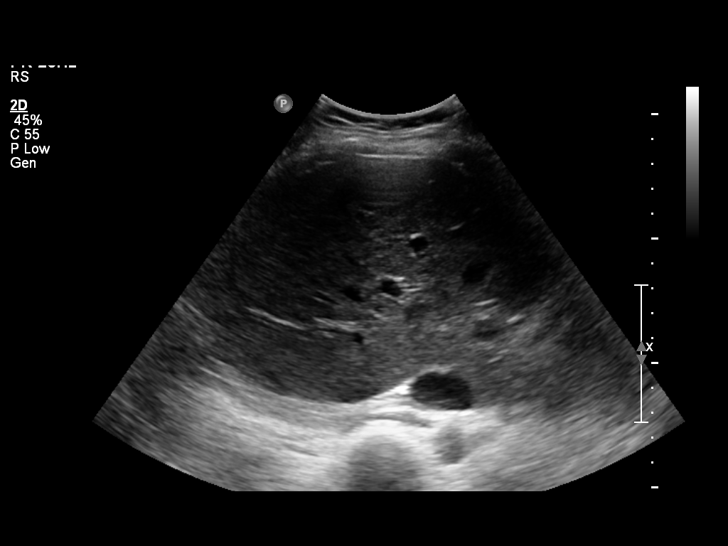
[im 40/74]
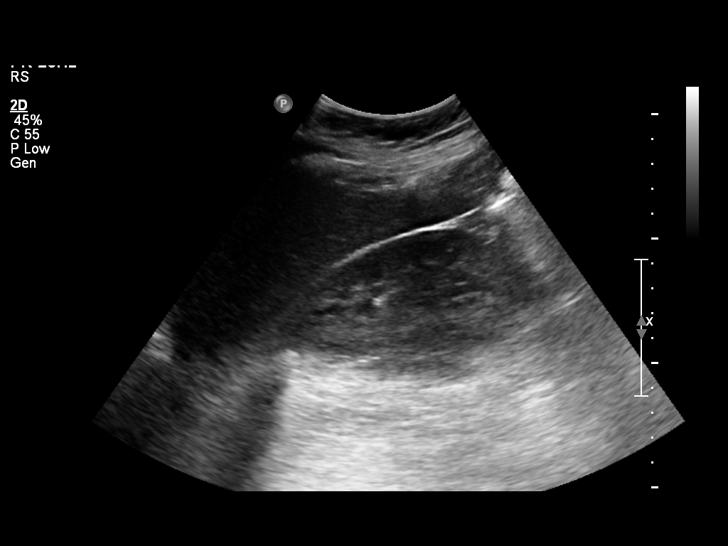
[im 46/74]
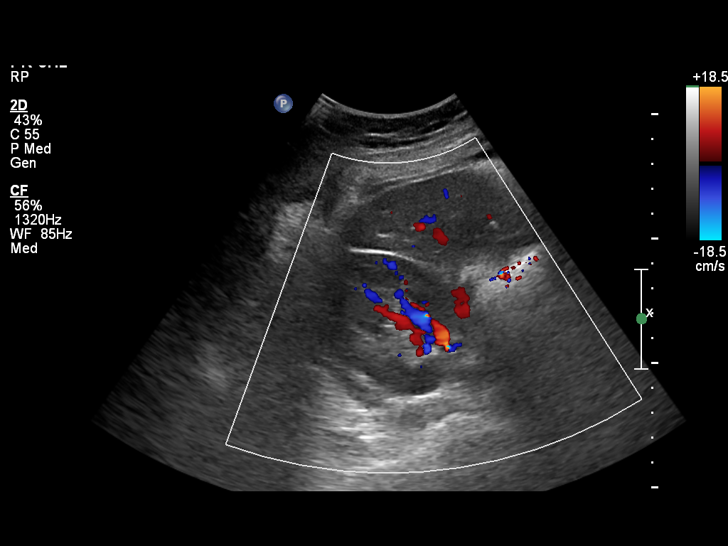
[im 49/74]
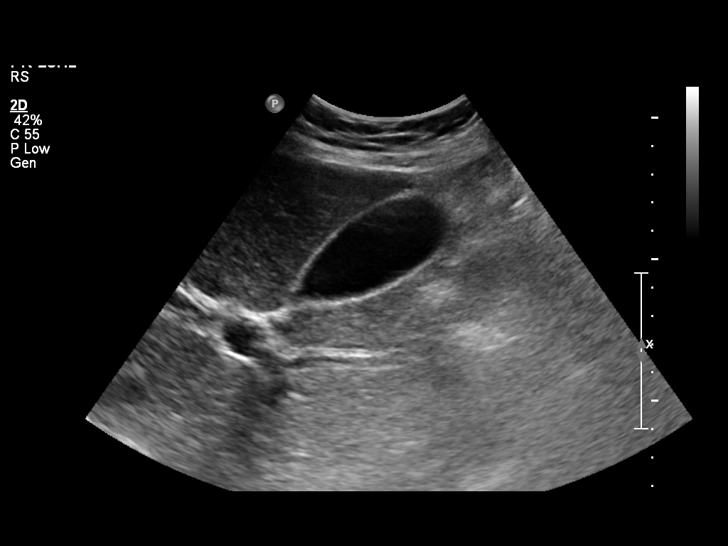
[im 55/74]
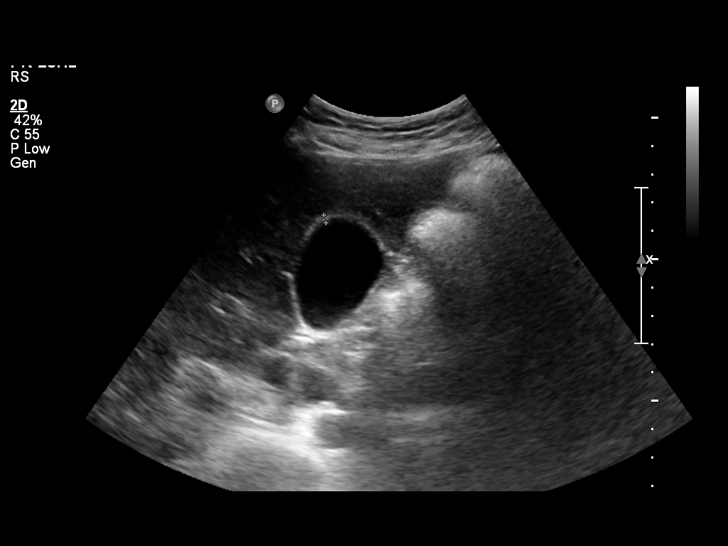
[im 61/74]
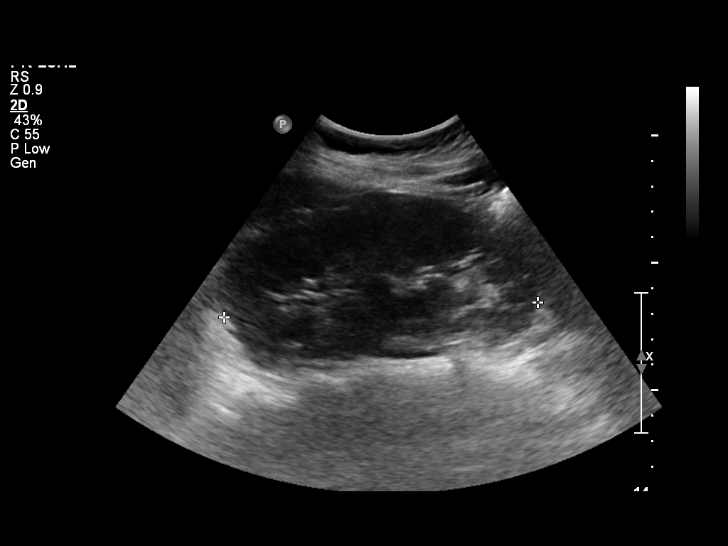
[im 67/74]
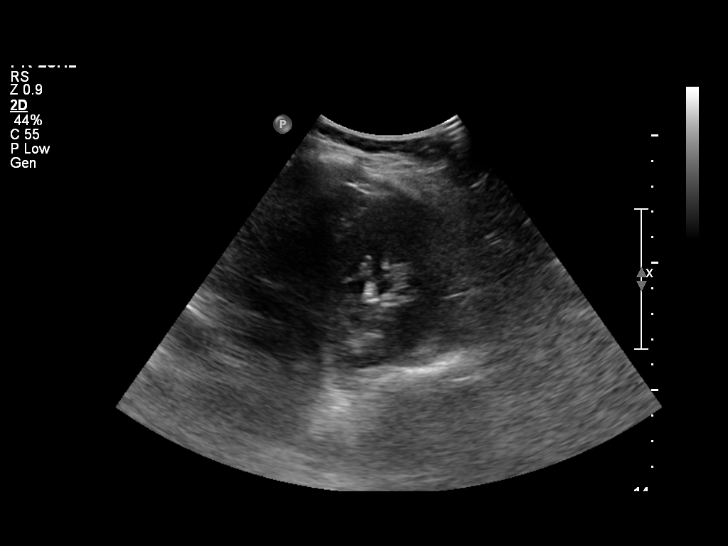
[im 74/74]
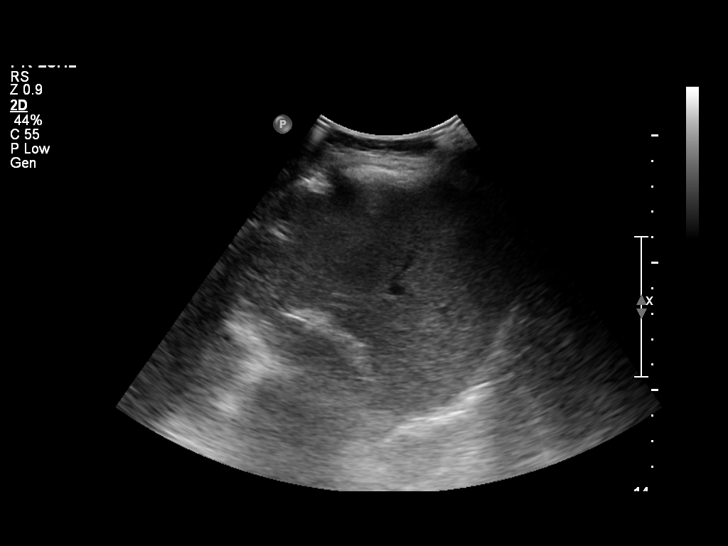

[14 of 25 positions shown; findings below may reference images not displayed]

FINDINGS: Gallbladder:  normal, without stone, wall thickening, or
      pericholecystic fluid.  Sonographic Murphy's sign was not
      elicited.

      Common bile duct:  normal, at 2 mm

      Liver:  normal in echogenicity without focal lesion.

      IVC:  within normal limits.

      Pancreas:  normal.

      Spleen:  normal in size and echotexture.

      Right Kidney:  12.4 cm.  No hydronephrosis.

      Left Kidney:  12.3 cm.  No hydronephrosis. On image 61, the
left renal pelvis appears full.  This is not confirmed on other
images, including image 64.  This is felt to be artifactual.

Abdominal aorta:  Non aneurysmal without ascites.
IMPRESSION: No acute process in the abdomen or explanation for right-sided
pain.

Likely artifactual apparent left renal pelvic fullness on initial
images.  If there are left-sided symptoms, follow-up ultrasound
could be performed.

## 2013-11-23 ENCOUNTER — Other Ambulatory Visit: Payer: Self-pay | Admitting: Obstetrics and Gynecology

## 2014-02-17 ENCOUNTER — Other Ambulatory Visit: Payer: Self-pay | Admitting: Family Medicine

## 2014-02-17 DIAGNOSIS — Z1231 Encounter for screening mammogram for malignant neoplasm of breast: Secondary | ICD-10-CM

## 2014-03-01 ENCOUNTER — Ambulatory Visit: Payer: BC Managed Care – PPO

## 2014-03-21 ENCOUNTER — Ambulatory Visit
Admission: RE | Admit: 2014-03-21 | Discharge: 2014-03-21 | Disposition: A | Discharge status: Home / Self Care | Payer: BC Managed Care – PPO | Source: Ambulatory Visit | Attending: Family Medicine | Admitting: Family Medicine

## 2014-03-21 DIAGNOSIS — Z1231 Encounter for screening mammogram for malignant neoplasm of breast: Secondary | ICD-10-CM

## 2014-04-25 ENCOUNTER — Encounter (HOSPITAL_COMMUNITY): Payer: Self-pay

## 2014-11-04 ENCOUNTER — Other Ambulatory Visit: Payer: Self-pay | Admitting: Family Medicine

## 2015-12-11 IMAGING — MG MM SCREEN MAMMOGRAM BILATERAL
4 series · 4 of 4 positions shown · non-contrast
Comparison: Previous exam(s).

CLINICAL DATA: Screening.

EXAM:
DIGITAL SCREENING BILATERAL MAMMOGRAM WITH CAD

[R CC]
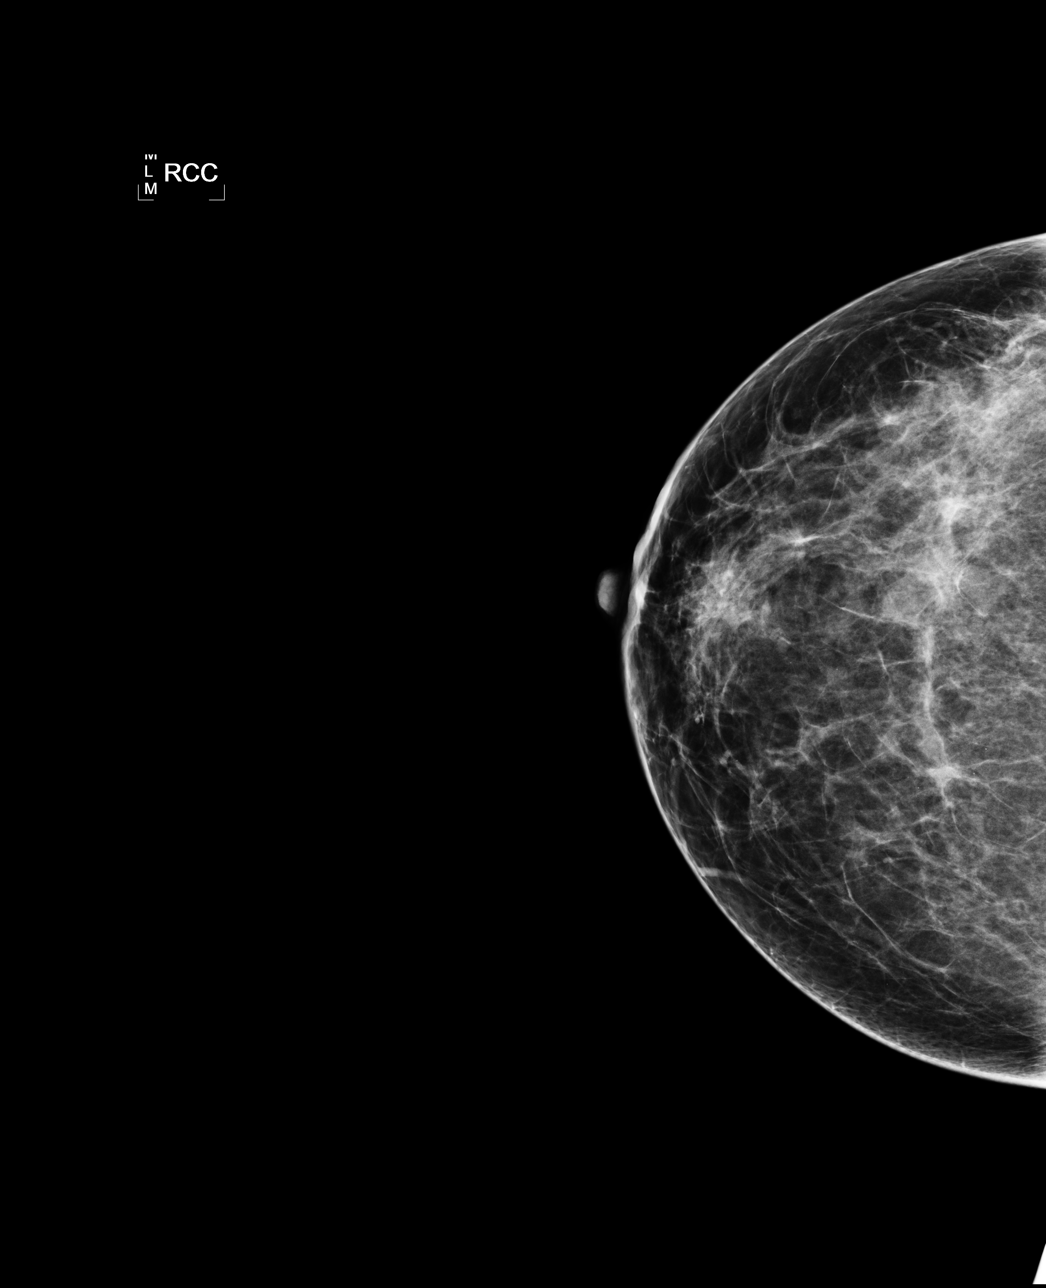

[L CC]
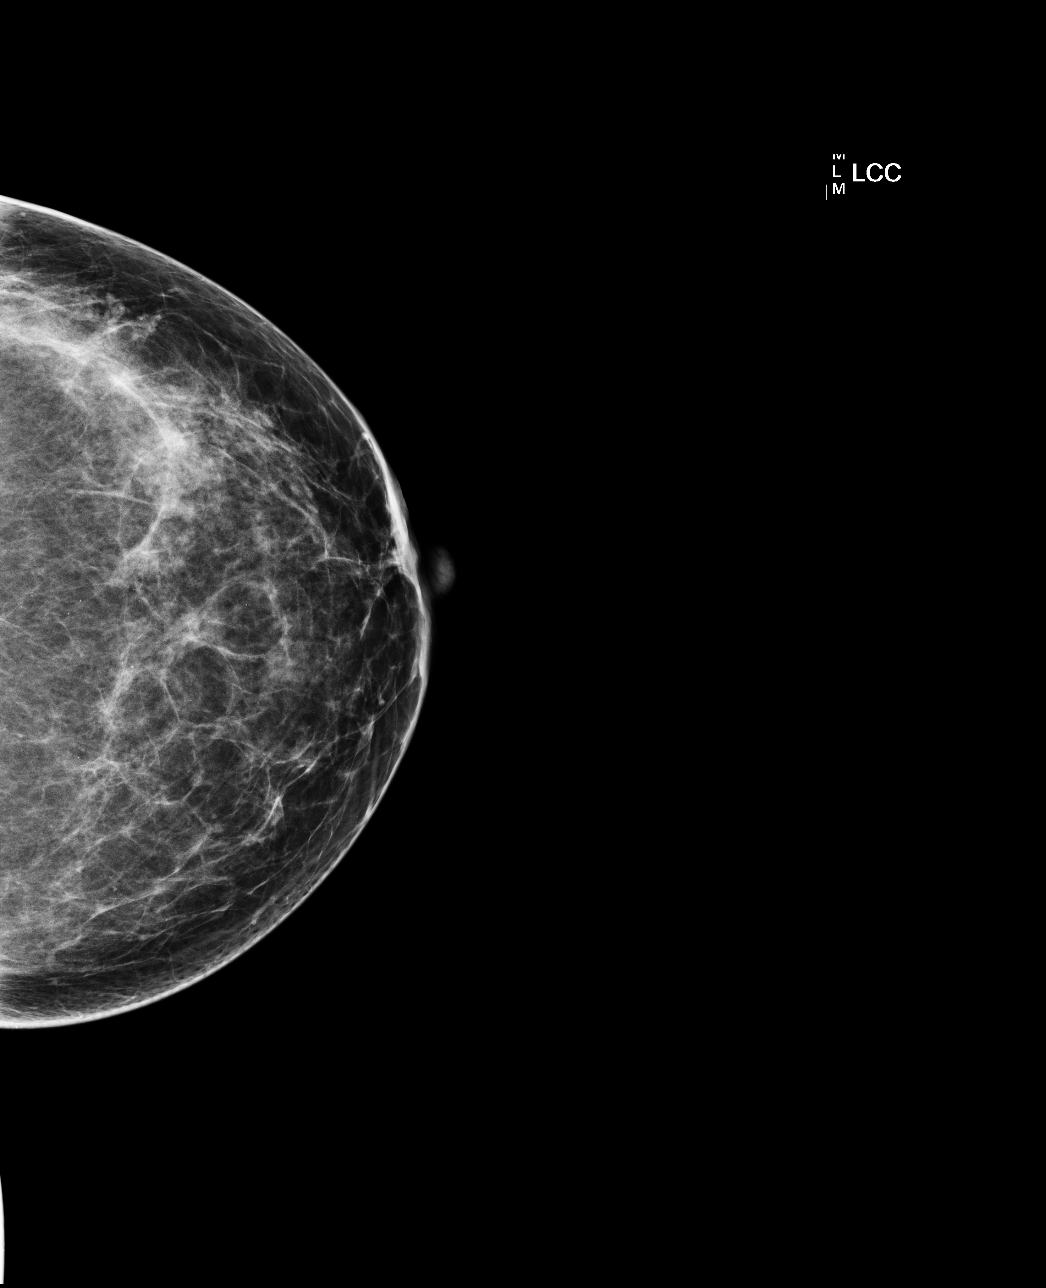

[L MLO]
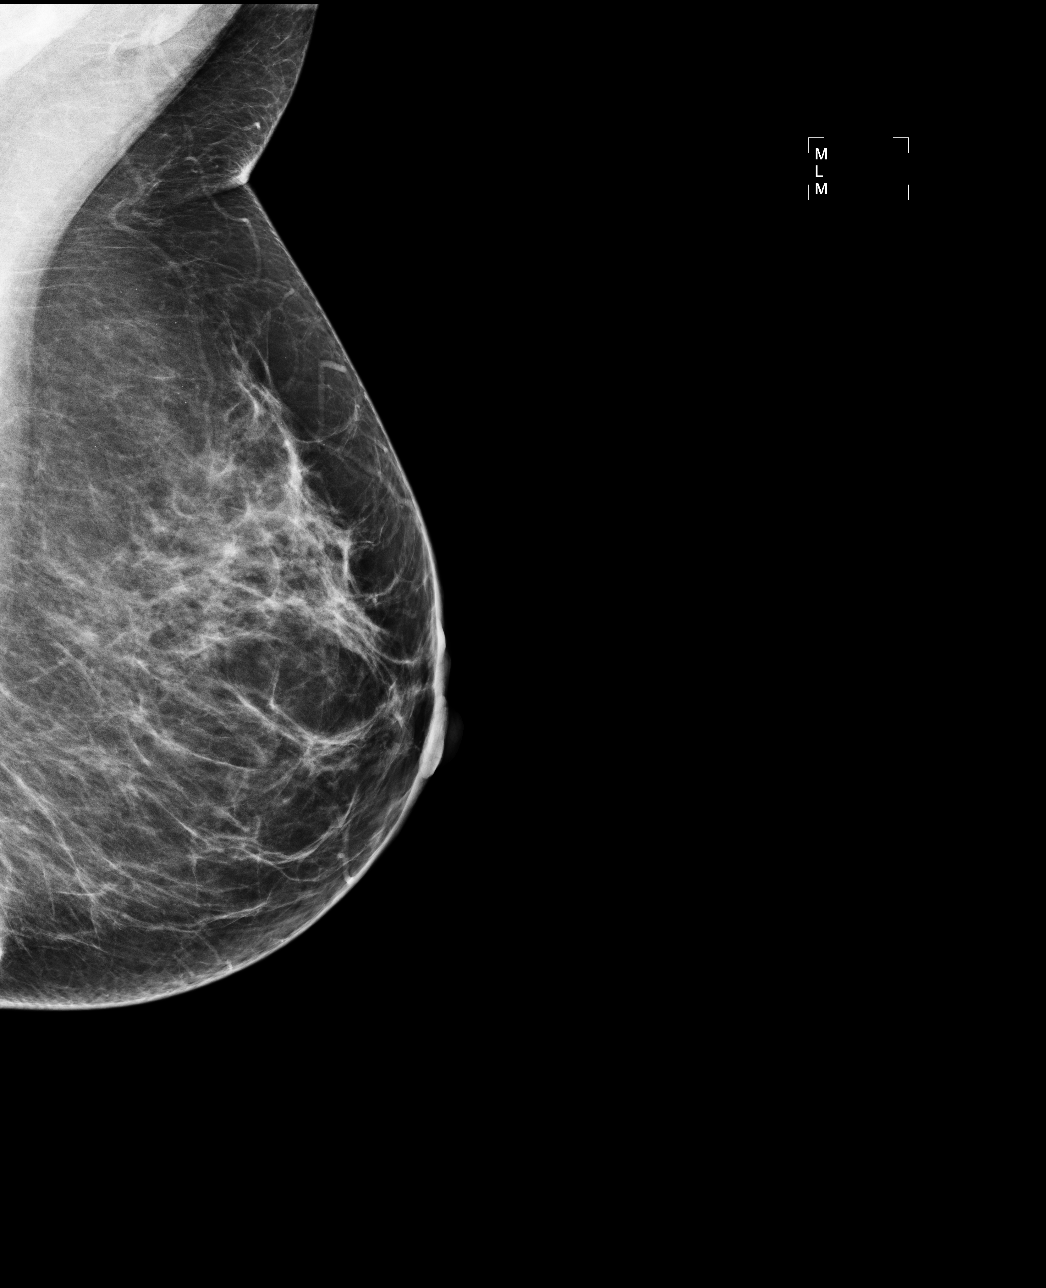

[R MLO]
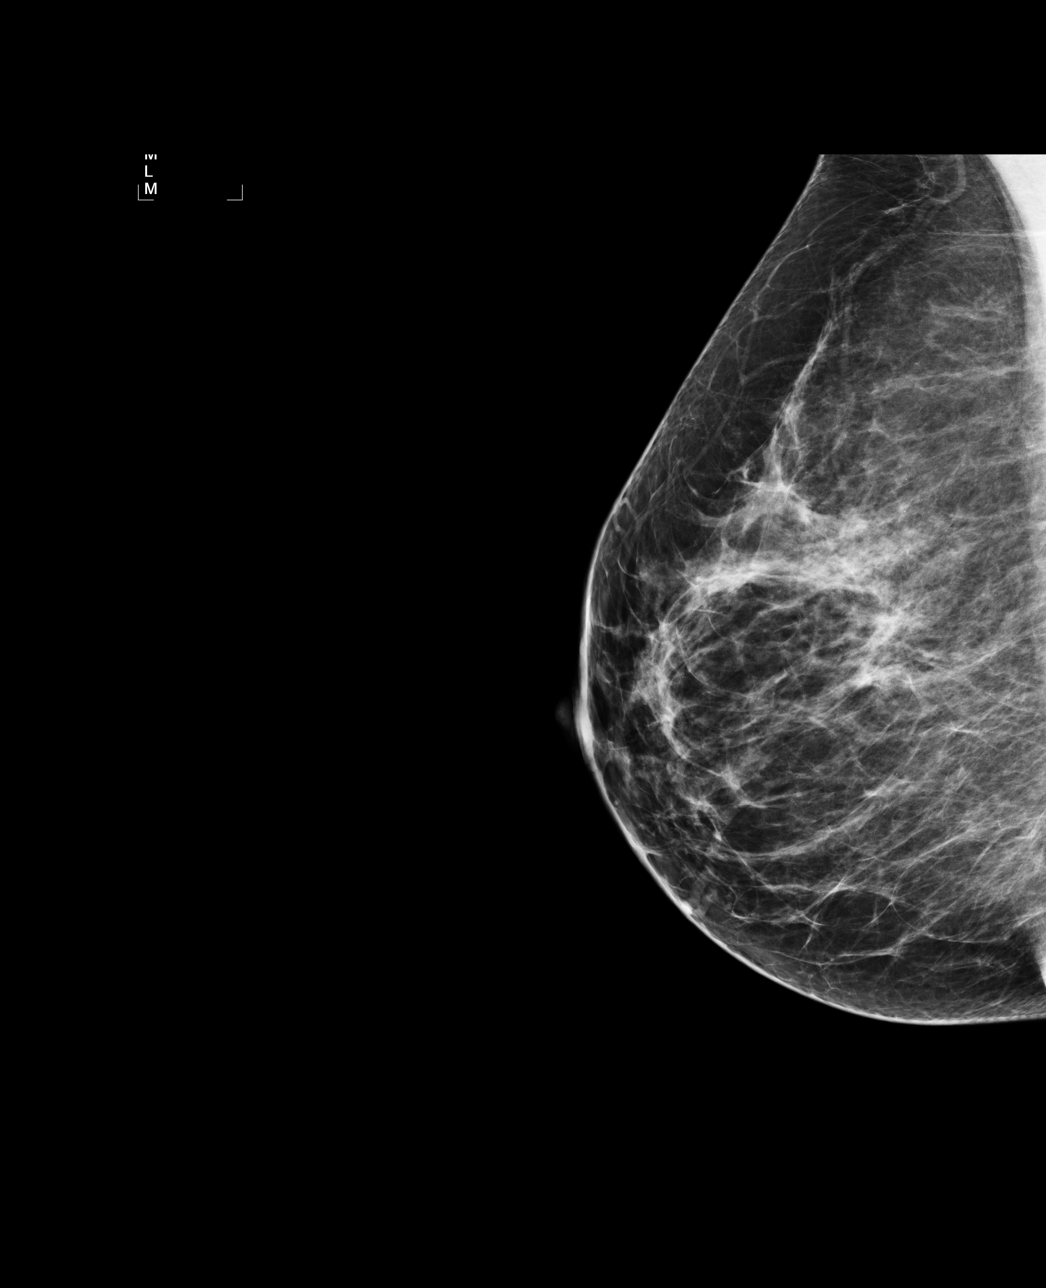

[4 of 4 positions shown; findings below may reference images not displayed]

ACR Breast Density Category c: The breast tissue is heterogeneously
dense, which may obscure small masses.
FINDINGS: There are no findings suspicious for malignancy. Images were
processed with CAD.
IMPRESSION: No mammographic evidence of malignancy. A result letter of this
screening mammogram will be mailed directly to the patient.

RECOMMENDATION:
Screening mammogram at age 40. (Code:BN-I-ALL)

BI-RADS CATEGORY  1: Negative.

## 2016-04-23 ENCOUNTER — Ambulatory Visit (INDEPENDENT_AMBULATORY_CARE_PROVIDER_SITE_OTHER): Payer: Self-pay | Admitting: Sports Medicine

## 2016-04-25 ENCOUNTER — Ambulatory Visit (INDEPENDENT_AMBULATORY_CARE_PROVIDER_SITE_OTHER): Payer: BLUE CROSS/BLUE SHIELD | Admitting: Sports Medicine

## 2016-04-25 ENCOUNTER — Encounter (INDEPENDENT_AMBULATORY_CARE_PROVIDER_SITE_OTHER): Payer: Self-pay | Admitting: Sports Medicine

## 2016-04-25 VITALS — BP 104/60 | HR 59 | Ht 66.0 in | Wt 147.0 lb

## 2016-04-25 DIAGNOSIS — M25511 Pain in right shoulder: Secondary | ICD-10-CM

## 2016-04-25 DIAGNOSIS — G43909 Migraine, unspecified, not intractable, without status migrainosus: Secondary | ICD-10-CM | POA: Insufficient documentation

## 2016-04-25 MED ORDER — MELOXICAM 15 MG PO TABS: 15.0000 mg | ORAL_TABLET | Freq: Every day | ORAL | 0 refills | Status: DC

## 2016-04-25 NOTE — Progress Notes (Signed)
Kim PedBrooke Warner - 35 y.o. female MRN [id number]  Date of birth: [birth year]  Office Visit Note: Visit Date: 04/25/2016 PCP: Deloris PingYTER-BROWN,SHERRY M, MD Referred by: Deloris Pingyter-Brown, Sherry M, *  Subjective: Chief Complaint  Patient presents with  . Right Shoulder - Pain   HPI: Patient states right shoulder pain one morning when she got up from bed.  States pain comes and goes.  Pain radiates in the right side of her neck and into her right arm.  Has numbness and tingling when pain radiates.  Hurts to raise right arm above her head when she does have pain.  Soreness today.    ROS Otherwise per HPI.  Assessment & Plan: Visit Diagnoses:  1. Acute pain of right shoulder     Plan: Findings:  AAOS Shoulder conditioning program. Anti-inflammatories Any lack of improvement will need further advanced imaging and/or consideration of subacromial injection.    Meds & Orders:  Meds ordered this encounter  Medications  . meloxicam (MOBIC) 15 MG tablet    Sig: Take 1 tablet (15 mg total) by mouth daily. Take 1 pill daily X 5 days then as needed    Dispense:  30 tablet    Refill:  0   No orders of the defined types were placed in this encounter.   Follow-up: Return if symptoms worsen or fail to improve.   Procedures: No procedures performed  No notes on file   Clinical History: No specialty comments available.  She reports that she has never smoked. She does not have any smokeless tobacco history on file. No results for input(s): HGBA1C, LABURIC in the last 8760 hours.  Objective:  VS:  HT:5\' 6"  (167.6 cm)   WT:147 lb (66.7 kg)  BMI:23.8    BP:104/60  HR:(!) 59bpm  TEMP: ( )  RESP:  Physical Exam  Constitutional: She appears well-developed and well-nourished. No distress.  HENT:  Head: Normocephalic and atraumatic.  Pulmonary/Chest: Effort normal. No respiratory distress.  Neurological: She is alert.  Appropriately interactive.  Skin: Skin is warm and dry. No rash noted. She is  not diaphoretic. No erythema. No pallor.  Psychiatric: She has a normal mood and affect. Her behavior is normal. Judgment and thought content normal.    Right Shoulder Exam   Comments:  Shows overall well aligned. She has small amount of pain over the anterior shoulder but no focal pain over the biceps are AC Joint. Pain with Hawkins as well as Neer's. This is mild. Good internal & external rotation strength. Empty can test is minimally painful. Minimal pain with speeds testing, small amount of pain with Yergason's testing. Some pain with axial loading & circumduction with small amount of crepitation that does seem to localize more to the sub-scapular areas as apposed to true intra-articular.     Imaging: No results found.  Past Medical/Family/Surgical/Social History: Medications & Allergies reviewed per EMR Patient Active Problem List   Diagnosis Date Noted  . Migraine headache 04/25/2016   Past Medical History:  Diagnosis Date  . GERD (gastroesophageal reflux disease)   . No pertinent past medical history   . PONV (postoperative nausea and vomiting)    No family history on file. Past Surgical History:  Procedure Laterality Date  . CESAREAN SECTION  2009  . CESAREAN SECTION  11/05/2011   Procedure: CESAREAN SECTION;  Surgeon: Jeani HawkingMichelle L Grewal, MD;  Location: [facility];  Service: Gynecology;  Laterality: N/A;  Bilateral Tubal Ligation, EDD 11/12/11   Social History   Occupational  History  . Not on file.   Social History Main Topics  . Smoking status: Never Smoker  . Smokeless tobacco: Not on file  . Alcohol use No  . Drug use: No  . Sexual activity: Not on file

## 2016-05-30 ENCOUNTER — Other Ambulatory Visit (INDEPENDENT_AMBULATORY_CARE_PROVIDER_SITE_OTHER): Payer: Self-pay | Admitting: Sports Medicine

## 2016-05-31 NOTE — Telephone Encounter (Signed)
Rx refill request

## 2017-09-02 ENCOUNTER — Encounter: Payer: Self-pay | Admitting: Gastroenterology

## 2017-10-17 ENCOUNTER — Ambulatory Visit: Payer: Self-pay | Admitting: Gastroenterology

## 2018-09-29 ENCOUNTER — Ambulatory Visit (INDEPENDENT_AMBULATORY_CARE_PROVIDER_SITE_OTHER): Payer: BLUE CROSS/BLUE SHIELD | Admitting: Psychology

## 2018-09-29 DIAGNOSIS — F411 Generalized anxiety disorder: Secondary | ICD-10-CM | POA: Diagnosis not present

## 2018-10-13 ENCOUNTER — Ambulatory Visit (INDEPENDENT_AMBULATORY_CARE_PROVIDER_SITE_OTHER): Payer: BLUE CROSS/BLUE SHIELD | Admitting: Psychology

## 2018-10-13 DIAGNOSIS — F411 Generalized anxiety disorder: Secondary | ICD-10-CM | POA: Diagnosis not present

## 2018-10-27 ENCOUNTER — Ambulatory Visit (INDEPENDENT_AMBULATORY_CARE_PROVIDER_SITE_OTHER): Payer: BLUE CROSS/BLUE SHIELD | Admitting: Psychology

## 2018-10-27 DIAGNOSIS — F411 Generalized anxiety disorder: Secondary | ICD-10-CM

## 2018-11-10 ENCOUNTER — Ambulatory Visit: Payer: BLUE CROSS/BLUE SHIELD | Admitting: Psychology

## 2018-11-24 ENCOUNTER — Ambulatory Visit: Payer: BLUE CROSS/BLUE SHIELD | Admitting: Psychology

## 2019-10-11 ENCOUNTER — Other Ambulatory Visit: Payer: Self-pay

## 2020-10-30 ENCOUNTER — Other Ambulatory Visit: Payer: Self-pay | Admitting: Obstetrics and Gynecology

## 2020-10-30 DIAGNOSIS — Z803 Family history of malignant neoplasm of breast: Secondary | ICD-10-CM

## 2022-05-03 ENCOUNTER — Encounter: Payer: Self-pay | Admitting: Gastroenterology

## 2022-05-28 ENCOUNTER — Ambulatory Visit (INDEPENDENT_AMBULATORY_CARE_PROVIDER_SITE_OTHER): Payer: Self-pay | Admitting: Gastroenterology

## 2022-05-28 ENCOUNTER — Encounter: Payer: Self-pay | Admitting: Gastroenterology

## 2022-05-28 VITALS — BP 118/78 | HR 62 | Ht 66.0 in | Wt 148.0 lb

## 2022-05-28 DIAGNOSIS — Z800 Family history of malignant neoplasm of digestive organs: Secondary | ICD-10-CM

## 2022-05-28 DIAGNOSIS — K219 Gastro-esophageal reflux disease without esophagitis: Secondary | ICD-10-CM

## 2022-05-28 MED ORDER — ESOMEPRAZOLE MAGNESIUM 40 MG PO CPDR: 40.0000 mg | DELAYED_RELEASE_CAPSULE | Freq: Two times a day (BID) | ORAL | 3 refills | Status: DC

## 2022-05-28 MED ORDER — NA SULFATE-K SULFATE-MG SULF 17.5-3.13-1.6 GM/177ML PO SOLN: 1.0000 | Freq: Once | ORAL | 0 refills | Status: AC

## 2022-05-28 NOTE — Patient Instructions (Signed)
_______________________________________________________  If you are age 43 or older, your body mass index should be between 23-30. Your Body mass index is 23.89 kg/m. If this is out of the aforementioned range listed, please consider follow up with your Primary Care Provider.  If you are age 19 or younger, your body mass index should be between 19-25. Your Body mass index is 23.89 kg/m. If this is out of the aformentioned range listed, please consider follow up with your Primary Care Provider.   You have been scheduled for a colonoscopy. Please follow written instructions given to you at your visit today.  Please pick up your prep supplies at the pharmacy within the next 1-3 days. If you use inhalers (even only as needed), please bring them with you on the day of your procedure.  We have sent the following medications to your pharmacy for you to pick up at your convenience: Nexium 40 mg twice daily    The [facility] providers would like to encourage you to use [facility] to communicate with providers for non-urgent requests or questions.  Due to long hold times on the telephone, sending your provider a message by [facility] may be a faster and more efficient way to get a response.  Please allow 48 business hours for a response.  Please remember that this is for non-urgent requests.   It was a pleasure to see you today!  Thank you for trusting me with your gastrointestinal care!    Scott E.Tomasa Rand, MD

## 2022-05-28 NOTE — Progress Notes (Signed)
HPI : Kim Warner is a very pleasant 41 year old female with no chronic medical history who is referred to [place] by Dr. Pricilla Holm for further evaluation and management of GERD symptoms.  Patient reports having mild GERD symptoms during pregnancy, but has otherwise never been bothered by GERD until the last 4 months.  She started having daily burning sensation and discomfort in her chest and throat.  Sometimes she would have throat irritation/'s globus sensation.  No problems with acid regurgitation.  No nausea or vomiting.  No dysphagia.  No abdominal pain.  Her appetite is normal. Her symptoms would occur during the day as well as the evenings.  No nighttime symptoms or nighttime awakenings.  Her symptoms did not vary significantly with her diet, although she has noted that alcohol will usually make her symptoms worse. She has recently lost 10 to 15 pounds after she took Ozempic for a month.  No recent weight gain prior to this.  Her symptoms did not change with the weight loss. She does not smoke and does not drink coffee.  She does not drink carbonated beverages.  She drinks alcohol on occasion. She was started on Nexium a few months ago, and is also taking Pepcid twice a day as well.  Despite this heavy acid suppressive regimen, she continues to have symptoms 3 to 4 days/week, although they are less severe than they were before she started therapy.  She has a family history of colon cancer with her maternal grand father being diagnosed at age 109 and a cousin on her mother side diagnosed at age 31.  Her mother was diagnosed with breast cancer at age 16.  The patient has regular bowel movements, usually passing stool once a day.  She denies problems with abdominal pain, constipation or blood in the stool. She states that she was tested for genetic mutations many years ago and none were identified.   Past Medical History:  Diagnosis Date   GERD (gastroesophageal reflux disease)    PONV  (postoperative nausea and vomiting)      Past Surgical History:  Procedure Laterality Date   CESAREAN SECTION  2009   CESAREAN SECTION  11/05/2011   Procedure: CESAREAN SECTION;  Surgeon: Jeani Hawking, MD;  Location: [facility];  Service: Gynecology;  Laterality: N/A;  Bilateral Tubal Ligation, EDD 11/12/11   Family History  Problem Relation Age of Onset   Colon cancer Maternal Grandfather 42   Colon cancer Cousin 104   Social History   Tobacco Use   Smoking status: Never  Substance Use Topics   Alcohol use: No   Drug use: No   Current Outpatient Medications  Medication Sig Dispense Refill   famotidine (PEPCID) 20 MG tablet TAKE 1 TABLET(20 MG) BY MOUTH TWICE DAILY     montelukast (SINGULAIR) 10 MG tablet Take 10 mg by mouth at bedtime.     tretinoin (RETIN-A) 0.05 % cream Apply topically 2 (two) times daily.     esomeprazole (NEXIUM) 40 MG capsule Take 40 mg by mouth daily.     No current facility-administered medications for this visit.   No Known Allergies   Review of Systems: All systems reviewed and negative except where noted in HPI.    No results found.  Physical Exam: BP 118/78   Pulse 62   Ht 5\' 6"  (1.676 m)   Wt 148 lb (67.1 kg)   SpO2 98%   BMI 23.89 kg/m  Constitutional: Pleasant,well-developed, Caucasian female in no acute distress.  HEENT: Normocephalic and atraumatic. Conjunctivae are normal. No scleral icterus. Neck supple.  Cardiovascular: Normal rate, regular rhythm.  Pulmonary/chest: Effort normal and breath sounds normal. No wheezing, rales or rhonchi. Abdominal: Soft, nondistended, nontender. Bowel sounds active throughout. There are no masses palpable. No hepatomegaly. Extremities: no edema Neurological: Alert and oriented to person place and time. Skin: Skin is warm and dry. No rashes noted. Psychiatric: Normal mood and affect. Behavior is normal.  CBC    Component Value Date/Time   WBC 16.0 (H) 11/06/2011 0510   RBC 3.58 (L)  11/06/2011 0510   HGB 11.6 (L) 11/06/2011 0510   HCT 33.9 (L) 11/06/2011 0510   PLT 134 (L) 11/06/2011 0510   MCV 94.7 11/06/2011 0510   MCH 32.4 11/06/2011 0510   MCHC 34.2 11/06/2011 0510   RDW 13.7 11/06/2011 0510   LYMPHSABS 2.1 04/11/2007 2215   MONOABS 0.8 (H) 04/11/2007 2215   EOSABS 0.0 04/11/2007 2215   BASOSABS 0.0 04/11/2007 2215    CMP  No results found for: "NA", "K", "CL", "CO2", "GLUCOSE", "BUN", "CREATININE", "CALCIUM", "PROT", "ALBUMIN", "AST", "ALT", "ALKPHOS", "BILITOT", "GFRNONAA", "GFRAA"   ASSESSMENT AND PLAN: 41 year old female with new onset GERD symptoms in the last 4 months in the absence of any dietary changes or weight gain.  No red flag symptoms, but her symptoms have not responded to high-dose acid suppression. We discussed the pathophysiology of GERD and the principles of GERD management to include lifestyle modifications  such as dietary discretion (avoidance of alcohol, tobacco, caffeinated and carbonated beverages, spicy/greasy foods, citrus, peppermint/chocolate), weight loss if applicable, head of bed elevation andconsuming last meal of day within 3 hours of bedtime; pharmacologic options to include PPIs, H2RAs and OTC antacids; and finally surgical or endoscopic fundoplication. The patient reports trying, very frequently.  We discussed how this may worsen GERD symptoms, and I suggest that she decrease her chewing gum consumption to see if this improves her symptoms.  We will also maximize her Nexium dose to 40 mg twice daily Given her new onset symptoms and suboptimal response to medical therapy, will plan for upper endoscopy.  The patient has 2 second-degree relatives on her mother side diagnosed with colon cancer at a young age (67s).  Her mother died from breast cancer in her 63s.  I would consider her increased risk for colon cancer and recommend early screening.   GERD despite PPI + BID H2RA - EGD -Increase Nexium to 40 mg twice daily - GERD  handout, recommended limiting chewing gum consumption  Fam hx CRC - Colonoscopy  The details, risks (including bleeding, perforation, infection, missed lesions, medication reactions and possible hospitalization or surgery if complications occur), benefits, and alternatives to EGD/colonoscopy with possible biopsy and possible polypectomy were discussed with the patient and she consents to proceed.   Jacky Dross E. Tomasa Rand, MD [facility]   Ryter-Brown, Fritzi Mandes, *

## 2022-07-26 ENCOUNTER — Encounter: Payer: Self-pay | Admitting: Gastroenterology

## 2022-10-02 ENCOUNTER — Telehealth: Payer: Self-pay | Admitting: Pharmacy Technician

## 2022-10-02 NOTE — Telephone Encounter (Signed)
Patient Advocate Encounter  Received notification from [facility] that prior authorization for ESOMEPRAZOLE 40MG  is required.   PA submitted on 4.10.24 Key [id number]  Status is pending

## 2022-10-03 NOTE — Telephone Encounter (Signed)
PA has been DENIED. Denial letter has been attached in patients documents.  

## 2022-12-19 ENCOUNTER — Other Ambulatory Visit: Payer: Self-pay | Admitting: Gastroenterology

## 2023-07-25 ENCOUNTER — Telehealth: Payer: Self-pay | Admitting: Gastroenterology

## 2023-07-25 NOTE — Telephone Encounter (Signed)
Good afternoon Dr. Tomasa Rand,    Patient is requesting to transfer her care over to Dr. Leonides Schanz. Patient is requesting a female provider. Please advise on request for transfer.    Thank you.

## 2023-07-30 NOTE — Telephone Encounter (Signed)
 Good morning Dr. Rosaline Coma,    Please review and advised on request of transfer below.    Thank you.

## 2023-08-01 NOTE — Telephone Encounter (Signed)
 Called patient to schedule with Dr. Rosaline Coma. Left voicemail.

## 2023-09-28 ENCOUNTER — Other Ambulatory Visit: Payer: Self-pay | Admitting: Gastroenterology

## 2023-12-13 ENCOUNTER — Other Ambulatory Visit: Payer: Self-pay | Admitting: Gastroenterology

## 2023-12-15 ENCOUNTER — Telehealth: Payer: Self-pay

## 2023-12-15 NOTE — Telephone Encounter (Signed)
 Pharmacy Patient Advocate Encounter   Received notification from CoverMyMeds that prior authorization for Esomeprazole  Magnesium  40MG  dr capsules is required/requested.   Insurance verification completed.   The patient is insured through [facility] .   Per test claim: Patient has not been seen since 2023 and needs an office visit to submit documentation to insurance.

## 2024-01-02 ENCOUNTER — Telehealth: Payer: Self-pay | Admitting: Internal Medicine

## 2024-01-02 NOTE — Telephone Encounter (Signed)
 Good afternoon Dr. Federico  The following patient called to set up a colonoscopy. She has been a patient of Dr. Stacia but she wants a woman to do her EGD/colon. Are you willing to perform the procedure and take her on as a patient. Please advise. Thank you.

## 2024-02-02 ENCOUNTER — Encounter: Payer: Self-pay | Admitting: Internal Medicine

## 2024-02-02 ENCOUNTER — Ambulatory Visit (AMBULATORY_SURGERY_CENTER)

## 2024-02-02 VITALS — Ht 66.0 in | Wt 140.0 lb

## 2024-02-02 DIAGNOSIS — K219 Gastro-esophageal reflux disease without esophagitis: Secondary | ICD-10-CM

## 2024-02-02 DIAGNOSIS — Z800 Family history of malignant neoplasm of digestive organs: Secondary | ICD-10-CM

## 2024-02-02 NOTE — Progress Notes (Addendum)
 No egg or soy allergy known to patient  No issues known to pt with past sedation with any surgeries or procedures Patient denies ever being told they had issues or difficulty with intubation - never intubated No FH of Malignant Hyperthermia Pt is not on diet pills Pt is not on  home 02  Pt is not on blood thinners  Pt has some issues with constipation and takes miralax -  Instructed to continue this up until the procedure No A fib or A flutter Have any cardiac testing pending--No Pt can ambulate  Pt denies use of chewing tobacco Discussed diabetic I weight loss medication holds Discussed NSAID holds Checked BMI Pt instructed to use [employer] or GoodRx for a price reduction on prep  Patient's chart reviewed by Norleen Schillings CNRA prior to previsit and patient appropriate for the [facility].  Pre visit completed and red dot placed by patient's name on their procedure day (on provider's schedule).    Patient already has Suprep at home.  Endocolon scheduled with Dr. Stacia on 02/18/24.  Pt had request TOC to a female physician.  Dr. Stacia agreed to [facility] to Dr. Federico who agreed to accept patient.  Endocolon with Dr. Stacia on 8/27 cancelled and rescheduled with Dr. Federico on 03/03/24 @ 1300.

## 2024-02-18 ENCOUNTER — Encounter: Payer: Self-pay | Admitting: Gastroenterology

## 2024-03-03 ENCOUNTER — Ambulatory Visit (AMBULATORY_SURGERY_CENTER): Payer: Self-pay | Admitting: Internal Medicine

## 2024-03-03 ENCOUNTER — Encounter: Payer: Self-pay | Admitting: Internal Medicine

## 2024-03-03 VITALS — BP 103/62 | HR 69 | Temp 98.0°F | Resp 14 | Ht 66.0 in | Wt 143.6 lb

## 2024-03-03 DIAGNOSIS — Z1211 Encounter for screening for malignant neoplasm of colon: Secondary | ICD-10-CM

## 2024-03-03 DIAGNOSIS — K648 Other hemorrhoids: Secondary | ICD-10-CM

## 2024-03-03 DIAGNOSIS — Z800 Family history of malignant neoplasm of digestive organs: Secondary | ICD-10-CM

## 2024-03-03 DIAGNOSIS — K219 Gastro-esophageal reflux disease without esophagitis: Secondary | ICD-10-CM

## 2024-03-03 DIAGNOSIS — K317 Polyp of stomach and duodenum: Secondary | ICD-10-CM | POA: Diagnosis not present

## 2024-03-03 MED ORDER — SODIUM CHLORIDE 0.9 % IV SOLN: 500.0000 mL | Freq: Once | INTRAVENOUS | Status: DC

## 2024-03-03 NOTE — Op Note (Signed)
 [facility] Patient Name: Kim Warner Procedure Date: 03/03/2024 2:03 PM MRN: [id number] Endoscopist: Rosario Estefana Kidney , , [id number] Age: 43 Referring MD:  Date of Birth: 10-Oct-1980 Gender: Female Account #: [id number] Procedure:                Upper GI endoscopy Indications:              Heartburn Medicines:                Monitored Anesthesia Care Procedure:                Pre-Anesthesia Assessment:                           - Prior to the procedure, a History and Physical                            was performed, and patient medications and                            allergies were reviewed. The patient's tolerance of                            previous anesthesia was also reviewed. The risks                            and benefits of the procedure and the sedation                            options and risks were discussed with the patient.                            All questions were answered, and informed consent                            was obtained. Prior Anticoagulants: The patient has                            taken no anticoagulant or antiplatelet agents. ASA                            Grade Assessment: II - A patient with mild systemic                            disease. After reviewing the risks and benefits,                            the patient was deemed in satisfactory condition to                            undergo the procedure.                           After obtaining informed consent, the endoscope was  passed under direct vision. Throughout the                            procedure, the patient's blood pressure, pulse, and                            oxygen saturations were monitored continuously. The                            Olympus Scope [id number] was introduced through the                            mouth, and advanced to the second part of duodenum.                            The upper GI endoscopy was  accomplished without                            difficulty. The patient tolerated the procedure                            well. Scope In: Scope Out: Findings:                 The examined esophagus was normal.                           A few sessile benign fundic gland polyps with no                            bleeding and no stigmata of recent bleeding were                            found in the gastric body.                           The examined duodenum was normal. Complications:            No immediate complications. Estimated Blood Loss:     Estimated blood loss: none. Impression:               - Normal esophagus.                           - A few fundic gland polyps.                           - Normal examined duodenum.                           - No specimens collected. Recommendation:           - Perform a colonoscopy today. Dr Estefana Federico Rosario Estefana Federico,  03/03/2024 2:36:43 PM

## 2024-03-03 NOTE — Progress Notes (Signed)
 Pt's states no medical or surgical changes since previsit or office visit.

## 2024-03-03 NOTE — Progress Notes (Signed)
 GASTROENTEROLOGY PROCEDURE H&P NOTE   Primary Care Physician: Clarance Joen HERO, MD    Reason for Procedure:   Colon cancer screening, GERD  Plan:    EGD/colonoscopy  Patient is appropriate for endoscopic procedure(s) in the ambulatory ([facility]) setting.  The nature of the procedure, as well as the risks, benefits, and alternatives were carefully and thoroughly reviewed with the patient. Ample time for discussion and questions allowed. The patient understood, was satisfied, and agreed to proceed.     HPI: Kim Warner is a 43 y.o. female who presents for EGD/colonoscopy for evaluation of colon cancer screening and GERD. Her acid reflux is mostly controlled on her Nexium  and Pepcid, though she is still feeling breakthrough reflux on occasion. Denies dysphagia or odynophagia. Denies N&V or ab abdominal pain. Denies rectal bleeding. She has had alternating constipation and diarrhea for most of her life.  Maternal grandfather was diagnosed with colon cancer at age 3 and a cousin in her mother's side was diagnosed at age 46.  Past Medical History:  Diagnosis Date   GERD (gastroesophageal reflux disease)    PONV (postoperative nausea and vomiting)     Past Surgical History:  Procedure Laterality Date   CESAREAN SECTION  2009   CESAREAN SECTION  11/05/2011   Procedure: CESAREAN SECTION;  Surgeon: Rosaline LITTIE Cobble, MD;  Location: [facility];  Service: Gynecology;  Laterality: N/A;  Bilateral Tubal Ligation, EDD 11/12/11    Prior to Admission medications   Medication Sig Start Date End Date Taking? Authorizing Provider  eletriptan (RELPAX) 40 MG tablet Take 40 mg by mouth once. 12/16/19   [provider]  esomeprazole  (NEXIUM ) 40 MG capsule TAKE 1 CAPSULE(40 MG) BY MOUTH TWICE DAILY 12/15/23   Cunningham, Scott E, MD  famotidine (PEPCID) 20 MG tablet TAKE 1 TABLET(20 MG) BY MOUTH TWICE DAILY 02/07/22   [provider]  montelukast  (SINGULAIR ) 10 MG tablet Take 10 mg by  mouth at bedtime.    [provider]  Na Sulfate-K Sulfate-Mg Sulfate concentrate (SUPREP) 17.5-3.13-1.6 GM/177ML SOLN Take 1 kit by mouth once.    [provider]  promethazine  (PHENERGAN ) 25 MG tablet Take 25 mg by mouth every 6 (six) hours as needed. 11/11/22   [provider]  tretinoin (RETIN-A) 0.05 % cream Apply topically 2 (two) times daily. 02/08/22   [provider]  valACYclovir (VALTREX) 500 MG tablet Take 1 tablet by mouth daily as needed.    [provider]    Current Outpatient Medications  Medication Sig Dispense Refill   eletriptan (RELPAX) 40 MG tablet Take 40 mg by mouth once.     esomeprazole  (NEXIUM ) 40 MG capsule TAKE 1 CAPSULE(40 MG) BY MOUTH TWICE DAILY 180 capsule 0   famotidine (PEPCID) 20 MG tablet TAKE 1 TABLET(20 MG) BY MOUTH TWICE DAILY     montelukast  (SINGULAIR ) 10 MG tablet Take 10 mg by mouth at bedtime.     Na Sulfate-K Sulfate-Mg Sulfate concentrate (SUPREP) 17.5-3.13-1.6 GM/177ML SOLN Take 1 kit by mouth once.     promethazine  (PHENERGAN ) 25 MG tablet Take 25 mg by mouth every 6 (six) hours as needed.     tretinoin (RETIN-A) 0.05 % cream Apply topically 2 (two) times daily.     valACYclovir (VALTREX) 500 MG tablet Take 1 tablet by mouth daily as needed.     No current facility-administered medications for this visit.    Allergies as of 03/03/2024   (No Known Allergies)    Family History  Problem Relation Age of Onset   Colon cancer Maternal Grandfather 47   Colon cancer Cousin 45   Stomach cancer Neg Hx    Esophageal cancer Neg Hx    Pancreatic cancer Neg Hx    Rectal cancer Neg Hx     Social History   Socioeconomic History   Marital status: Married    Spouse name: Not on file   Number of children: Not on file   Years of education: Not on file   Highest education level: Not on file  Occupational History   Not on file  Tobacco Use   Smoking status: Never   Smokeless tobacco: Not on file   Vaping Use   Vaping status: Never Used  Substance and Sexual Activity   Alcohol use: Yes    Comment: Socially   Drug use: No   Sexual activity: Not on file  Other Topics Concern   Not on file  Social History Narrative   Not on file   Social Drivers of Health   Financial Resource Strain: Low Risk  (02/12/2021)   Received [facility]   Overall Financial Resource Strain (CARDIA)    Difficulty of Paying Living Expenses: Not very hard  Food Insecurity: No Food Insecurity (07/20/2021)   Received from [facility]   Hunger Vital Sign    Within the past 12 months, you worried that your food would run out before you got the money to buy more.: Never true    Within the past 12 months, the food you bought just didn't last and you didn't have money to get more.: Never true  Transportation Needs: No Transportation Needs (02/12/2021)   Received from [facility] - Transportation    Lack of Transportation (Medical): No    Lack of Transportation (Non-Medical): No  Physical Activity: Sufficiently Active (02/12/2021)   Received from [facility]   Exercise Vital Sign    On average, how many days per week do you engage in moderate to strenuous exercise (like a brisk walk)?: 5 days    On average, how many minutes do you engage in exercise at this level?: 40 min  Stress: No Stress Concern Present (02/12/2021)   Received from [facility] - Occupational Stress Questionnaire    Feeling of Stress : Not at all  Social Connections: Unknown (10/26/2021)   Received from [facility]   Social Network    Social Network: Not on file  Intimate Partner Violence: Unknown (09/24/2021)   Received from [facility]   HITS    Physically Hurt: Not on file    Insult or Talk Down To: Not on file    Threaten Physical Harm: Not on file    Scream or Curse: Not on file    Physical Exam: Vital signs in last 24 hours: There were no vitals taken for this  visit. GEN: NAD EYE: Sclerae anicteric ENT: MMM CV: Non-tachycardic Pulm: No increased work of breathing GI: Soft, NT/ND NEURO:  Alert & Oriented   Estefana Kidney, MD [facility]  03/03/2024 1:01 PM

## 2024-03-03 NOTE — Patient Instructions (Signed)

## 2024-03-03 NOTE — Op Note (Signed)
 [facility] Patient Name: Kim Warner Procedure Date: 03/03/2024 1:57 PM MRN: [id number] Endoscopist: Rosario Estefana Kidney , , [phone] Age: 43 Referring MD:  Date of Birth: [birth year] Gender: Female Account #: [id number] Procedure:                Colonoscopy Indications:              Screening for colon cancer: Family history of                            colorectal cancer in multiple 2nd degree relatives Medicines:                Monitored Anesthesia Care Procedure:                Pre-Anesthesia Assessment:                           - Prior to the procedure, a History and Physical                            was performed, and patient medications and                            allergies were reviewed. The patient's tolerance of                            previous anesthesia was also reviewed. The risks                            and benefits of the procedure and the sedation                            options and risks were discussed with the patient.                            All questions were answered, and informed consent                            was obtained. Prior Anticoagulants: The patient has                            taken no anticoagulant or antiplatelet agents. ASA                            Grade Assessment: II - A patient with mild systemic                            disease. After reviewing the risks and benefits,                            the patient was deemed in satisfactory condition to                            undergo the procedure.  After obtaining informed consent, the colonoscope                            was passed under direct vision. Throughout the                            procedure, the patient's blood pressure, pulse, and                            oxygen saturations were monitored continuously. The                            [id number] Colonoscope [id number] was introduced                            through  the anus and advanced to the the terminal                            ileum. The colonoscopy was performed without                            difficulty. The patient tolerated the procedure                            well. The quality of the bowel preparation was                            good. The terminal ileum, ileocecal valve,                            appendiceal orifice, and rectum were photographed. Scope In: 2:17:27 PM Scope Out: 2:32:54 PM Scope Withdrawal Time: 0 hours 11 minutes 19 seconds  Total Procedure Duration: 0 hours 15 minutes 27 seconds  Findings:                 The terminal ileum appeared normal.                           Non-bleeding internal hemorrhoids were found during                            retroflexion. Complications:            No immediate complications. Estimated Blood Loss:     Estimated blood loss: none. Impression:               - The examined portion of the ileum was normal.                           - Non-bleeding internal hemorrhoids.                           - No specimens collected. Recommendation:           - Discharge patient to home (with escort).                           -  Repeat colonoscopy in 10 years for screening                            purposes.                           - The findings and recommendations were discussed                            with the patient. Dr Estefana Federico Rosario Estefana Federico,  03/03/2024 2:39:00 PM

## 2024-03-03 NOTE — Progress Notes (Signed)
 Vss nad trans to pacu

## 2024-03-04 ENCOUNTER — Telehealth: Payer: Self-pay | Admitting: *Deleted

## 2024-03-04 NOTE — Telephone Encounter (Signed)
 No answer after post call. Left a message.

## 2024-03-11 ENCOUNTER — Other Ambulatory Visit: Payer: Self-pay | Admitting: Gastroenterology

## 2024-03-19 ENCOUNTER — Other Ambulatory Visit: Payer: Self-pay | Admitting: Gastroenterology
